# Patient Record
Sex: Male | Born: 1940 | Race: White | Hispanic: No | Marital: Married | Smoking: Never smoker
Health system: Southern US, Community
[De-identification: ages and names within clinical notes are randomized; demographics above are authoritative.]

## PROBLEM LIST (undated history)

## (undated) DIAGNOSIS — C61 Malignant neoplasm of prostate: Secondary | ICD-10-CM

## (undated) DIAGNOSIS — E785 Hyperlipidemia, unspecified: Secondary | ICD-10-CM

## (undated) DIAGNOSIS — L570 Actinic keratosis: Secondary | ICD-10-CM

## (undated) DIAGNOSIS — G43909 Migraine, unspecified, not intractable, without status migrainosus: Secondary | ICD-10-CM

## (undated) DIAGNOSIS — E039 Hypothyroidism, unspecified: Secondary | ICD-10-CM

## (undated) DIAGNOSIS — I7300 Raynaud's syndrome without gangrene: Secondary | ICD-10-CM

## (undated) HISTORY — PX: PROSTATE SURGERY: SHX751

## (undated) HISTORY — DX: Actinic keratosis: L57.0

## (undated) HISTORY — PX: THYROID SURGERY: SHX805

## (undated) HISTORY — PX: TONSILLECTOMY: SUR1361

---

## 2009-04-06 ENCOUNTER — Ambulatory Visit: Payer: Self-pay | Admitting: Internal Medicine

## 2014-04-28 ENCOUNTER — Ambulatory Visit: Payer: Self-pay | Admitting: Gastroenterology

## 2014-05-02 LAB — PATHOLOGY REPORT

## 2016-01-27 ENCOUNTER — Emergency Department: Payer: Medicare Other

## 2016-01-27 ENCOUNTER — Observation Stay: Payer: Medicare Other

## 2016-01-27 ENCOUNTER — Observation Stay
Admit: 2016-01-27 | Discharge: 2016-01-27 | Disposition: A | Discharge status: Home / Self Care | Payer: Medicare Other | Attending: Family Medicine | Admitting: Family Medicine

## 2016-01-27 ENCOUNTER — Observation Stay
Admission: EM | Admit: 2016-01-27 | Discharge: 2016-01-27 | Disposition: A | Discharge status: Home / Self Care | Payer: Medicare Other | Attending: Internal Medicine | Admitting: Internal Medicine

## 2016-01-27 ENCOUNTER — Encounter: Payer: Self-pay | Admitting: Emergency Medicine

## 2016-01-27 DIAGNOSIS — E785 Hyperlipidemia, unspecified: Secondary | ICD-10-CM | POA: Insufficient documentation

## 2016-01-27 DIAGNOSIS — Z823 Family history of stroke: Secondary | ICD-10-CM | POA: Insufficient documentation

## 2016-01-27 DIAGNOSIS — E039 Hypothyroidism, unspecified: Secondary | ICD-10-CM | POA: Diagnosis not present

## 2016-01-27 DIAGNOSIS — G454 Transient global amnesia: Secondary | ICD-10-CM | POA: Diagnosis not present

## 2016-01-27 DIAGNOSIS — Z8546 Personal history of malignant neoplasm of prostate: Secondary | ICD-10-CM | POA: Insufficient documentation

## 2016-01-27 DIAGNOSIS — Z8669 Personal history of other diseases of the nervous system and sense organs: Secondary | ICD-10-CM | POA: Insufficient documentation

## 2016-01-27 DIAGNOSIS — Z9889 Other specified postprocedural states: Secondary | ICD-10-CM | POA: Insufficient documentation

## 2016-01-27 DIAGNOSIS — G459 Transient cerebral ischemic attack, unspecified: Secondary | ICD-10-CM | POA: Diagnosis not present

## 2016-01-27 DIAGNOSIS — R413 Other amnesia: Secondary | ICD-10-CM | POA: Diagnosis present

## 2016-01-27 DIAGNOSIS — H409 Unspecified glaucoma: Secondary | ICD-10-CM | POA: Insufficient documentation

## 2016-01-27 DIAGNOSIS — Z882 Allergy status to sulfonamides status: Secondary | ICD-10-CM | POA: Diagnosis not present

## 2016-01-27 DIAGNOSIS — I6522 Occlusion and stenosis of left carotid artery: Secondary | ICD-10-CM | POA: Insufficient documentation

## 2016-01-27 HISTORY — DX: Malignant neoplasm of prostate: C61

## 2016-01-27 HISTORY — DX: Migraine, unspecified, not intractable, without status migrainosus: G43.909

## 2016-01-27 LAB — COMPREHENSIVE METABOLIC PANEL
ALBUMIN: 4.3 g/dL (ref 3.5–5.0)
ALT: 20 U/L (ref 17–63)
AST: 23 U/L (ref 15–41)
Alkaline Phosphatase: 57 U/L (ref 38–126)
Anion gap: 5 (ref 5–15)
BUN: 14 mg/dL (ref 6–20)
CHLORIDE: 107 mmol/L (ref 101–111)
CO2: 25 mmol/L (ref 22–32)
CREATININE: 0.77 mg/dL (ref 0.61–1.24)
Calcium: 9.2 mg/dL (ref 8.9–10.3)
GFR calc Af Amer: >60 mL/min (ref >60)
GFR calc non Af Amer: >60 mL/min (ref >60)
Glucose, Bld: 118 mg/dL — ABNORMAL HIGH (ref 65–99)
Potassium: 3.9 mmol/L (ref 3.5–5.1)
SODIUM: 137 mmol/L (ref 135–145)
Total Bilirubin: 0.5 mg/dL (ref 0.3–1.2)
Total Protein: 6.8 g/dL (ref 6.5–8.1)

## 2016-01-27 LAB — CBC
HCT: 46.7 % (ref 40.0–52.0)
HEMOGLOBIN: 16 g/dL (ref 13.0–18.0)
MCH: 31 pg (ref 26.0–34.0)
MCHC: 34.3 g/dL (ref 32.0–36.0)
MCV: 90.3 fL (ref 80.0–100.0)
PLATELETS: 210 10*3/uL (ref 150–440)
RBC: 5.17 MIL/uL (ref 4.40–5.90)
RDW: 14.1 % (ref 11.5–14.5)
WBC: 7.3 10*3/uL (ref 3.8–10.6)

## 2016-01-27 LAB — BASIC METABOLIC PANEL
Anion gap: 5 (ref 5–15)
BUN: 12 mg/dL (ref 6–20)
CALCIUM: 8.8 mg/dL — AB (ref 8.9–10.3)
CO2: 25 mmol/L (ref 22–32)
CREATININE: 0.77 mg/dL (ref 0.61–1.24)
Chloride: 109 mmol/L (ref 101–111)
Glucose, Bld: 112 mg/dL — ABNORMAL HIGH (ref 65–99)
Potassium: 4.2 mmol/L (ref 3.5–5.1)
SODIUM: 139 mmol/L (ref 135–145)

## 2016-01-27 LAB — CBC WITH DIFFERENTIAL/PLATELET
BASOS ABS: 0.1 10*3/uL (ref 0–0.1)
BASOS PCT: 1 %
EOS ABS: 0.2 10*3/uL (ref 0–0.7)
EOS PCT: 3 %
HCT: 49.5 % (ref 40.0–52.0)
Hemoglobin: 17 g/dL (ref 13.0–18.0)
Lymphocytes Relative: 35 %
Lymphs Abs: 2.3 10*3/uL (ref 1.0–3.6)
MCH: 30.8 pg (ref 26.0–34.0)
MCHC: 34.3 g/dL (ref 32.0–36.0)
MCV: 89.8 fL (ref 80.0–100.0)
MONO ABS: 0.6 10*3/uL (ref 0.2–1.0)
Monocytes Relative: 9 %
NEUTROS PCT: 52 %
Neutro Abs: 3.4 10*3/uL (ref 1.4–6.5)
PLATELETS: 217 10*3/uL (ref 150–440)
RBC: 5.51 MIL/uL (ref 4.40–5.90)
RDW: 13.8 % (ref 11.5–14.5)
WBC: 6.5 10*3/uL (ref 3.8–10.6)

## 2016-01-27 LAB — URINALYSIS COMPLETE WITH MICROSCOPIC (ARMC ONLY)
Bacteria, UA: NONE SEEN
Bilirubin Urine: NEGATIVE
GLUCOSE, UA: NEGATIVE mg/dL
Hgb urine dipstick: NEGATIVE
KETONES UR: NEGATIVE mg/dL
Leukocytes, UA: NEGATIVE
NITRITE: NEGATIVE
PH: 6 (ref 5.0–8.0)
Protein, ur: NEGATIVE mg/dL
RBC / HPF: NONE SEEN RBC/hpf (ref 0–5)
SPECIFIC GRAVITY, URINE: 1.01 (ref 1.005–1.030)
Squamous Epithelial / LPF: NONE SEEN
WBC UA: NONE SEEN WBC/hpf (ref 0–5)

## 2016-01-27 LAB — VITAMIN B12: VITAMIN B 12: 556 pg/mL (ref 180–914)

## 2016-01-27 LAB — LIPID PANEL
CHOL/HDL RATIO: 3.9 ratio
CHOLESTEROL: 186 mg/dL (ref 0–200)
HDL: 48 mg/dL (ref >40)
LDL Cholesterol: 128 mg/dL — ABNORMAL HIGH (ref 0–99)
TRIGLYCERIDES: 48 mg/dL (ref <150)
VLDL: 10 mg/dL (ref 0–40)

## 2016-01-27 LAB — TROPONIN I

## 2016-01-27 LAB — FOLATE: FOLATE: 33 ng/mL (ref >5.9)

## 2016-01-27 LAB — TSH: TSH: 0.871 u[IU]/mL (ref 0.350–4.500)

## 2016-01-27 MED ORDER — ENOXAPARIN SODIUM 40 MG/0.4ML ~~LOC~~ SOLN
40.0000 mg | Freq: Every day | SUBCUTANEOUS | Status: DC
  Administered 2016-01-27: 40 mg via SUBCUTANEOUS
  Filled 2016-01-27: qty 0.4

## 2016-01-27 MED ORDER — ACETAMINOPHEN 325 MG PO TABS: 650.0000 mg | ORAL_TABLET | Freq: Four times a day (QID) | ORAL | Status: DC | PRN

## 2016-01-27 MED ORDER — SODIUM CHLORIDE 0.9% FLUSH
3.0000 mL | Freq: Two times a day (BID) | INTRAVENOUS | Status: DC
  Administered 2016-01-27: 3 mL via INTRAVENOUS

## 2016-01-27 MED ORDER — LEVOTHYROXINE SODIUM 100 MCG PO TABS
100.0000 ug | ORAL_TABLET | Freq: Every day | ORAL | Status: DC
  Filled 2016-01-27: qty 1

## 2016-01-27 MED ORDER — ASPIRIN 81 MG PO CHEW
324.0000 mg | CHEWABLE_TABLET | Freq: Once | ORAL | Status: AC
  Administered 2016-01-27: 324 mg via ORAL
  Filled 2016-01-27: qty 4

## 2016-01-27 MED ORDER — METOPROLOL TARTRATE 5 MG/5ML IV SOLN: 5.0000 mg | INTRAVENOUS | Status: DC | PRN

## 2016-01-27 MED ORDER — ACYCLOVIR 200 MG PO CAPS: 800.0000 mg | ORAL_CAPSULE | Freq: Every day | ORAL | Status: DC

## 2016-01-27 MED ORDER — ONDANSETRON HCL 4 MG/2ML IJ SOLN: 4.0000 mg | Freq: Four times a day (QID) | INTRAMUSCULAR | Status: DC | PRN

## 2016-01-27 MED ORDER — ACETAMINOPHEN 650 MG RE SUPP: 650.0000 mg | Freq: Four times a day (QID) | RECTAL | Status: DC | PRN

## 2016-01-27 MED ORDER — SENNOSIDES-DOCUSATE SODIUM 8.6-50 MG PO TABS: 1.0000 | ORAL_TABLET | Freq: Every evening | ORAL | Status: DC | PRN

## 2016-01-27 MED ORDER — ATORVASTATIN CALCIUM 40 MG PO TABS: 40.0000 mg | ORAL_TABLET | Freq: Every day | ORAL | Status: DC

## 2016-01-27 MED ORDER — ASPIRIN 81 MG PO CHEW
81.0000 mg | CHEWABLE_TABLET | Freq: Every day | ORAL | Status: DC
  Administered 2016-01-27: 81 mg via ORAL
  Filled 2016-01-27: qty 1

## 2016-01-27 MED ORDER — LATANOPROST 0.005 % OP SOLN
1.0000 [drp] | Freq: Every day | OPHTHALMIC | Status: DC
  Filled 2016-01-27: qty 2.5

## 2016-01-27 MED ORDER — ASPIRIN 81 MG PO CHEW: 81.0000 mg | CHEWABLE_TABLET | Freq: Every day | ORAL | Status: DC

## 2016-01-27 MED ORDER — ONDANSETRON HCL 4 MG PO TABS: 4.0000 mg | ORAL_TABLET | Freq: Four times a day (QID) | ORAL | Status: DC | PRN

## 2016-01-27 MED ORDER — ACYCLOVIR 800 MG PO TABS
800.0000 mg | ORAL_TABLET | Freq: Every day | ORAL | Status: DC
  Administered 2016-01-27: 08:00:00 800 mg via ORAL
  Filled 2016-01-27: qty 1

## 2016-01-27 NOTE — Care Management Obs Status (Signed)
[facility] NOTIFICATION   Patient Details  Name: Ralph Brown MRN: [id number] Date of Birth: 07/08/1941   Medicare Observation Status Notification Given:  No (discharge order less than 24 hours)    Ival Bible, RN 01/27/2016, 5:51 PM

## 2016-01-27 NOTE — Progress Notes (Signed)
Pt has been discharged home. Discharge papers giving and explained to pt. F/U appointment and meds reviewed with pt. Pt to pick up RX from pharmacy and to scheduled the f/u appointment. Pt is aware. Pt declined to be escorted on a wheelchair.

## 2016-01-27 NOTE — ED Notes (Signed)
Admitting MD at bedside.

## 2016-01-27 NOTE — Discharge Summary (Signed)
[facility] [place] NAME: Ralph Brown    MR#:  [id number]  DATE OF BIRTH:  December 21, 1940  DATE OF ADMISSION:  01/27/2016 ADMITTING PHYSICIAN: Quintella Baton, MD  DATE OF DISCHARGE: 01/27/2016  PRIMARY CARE PHYSICIAN: Dr. Fulton Reek   ADMISSION DIAGNOSIS:  Memory deficit [R41.3] Transient cerebral ischemia, unspecified transient cerebral ischemia type [G45.9]  DISCHARGE DIAGNOSIS:  Transient global and lesion  SECONDARY DIAGNOSIS:   Past Medical History  Diagnosis Date  . Migraines   . Prostate cancer [facility])     HOSPITAL COURSE:   1. Transient global amnesia. If stroke workup comes back negative including MRI of the brain and carotid ultrasound and echocardiogram the patient can potentially go home today. I would continue an aspirin at this point. 2. Hypothyroidism unspecified. Continue levothyroxine. TSH in the normal range. 3. Glaucoma unspecified on latanoprost 4. History of migraine 5. History of prostate cancer   DISCHARGE CONDITIONS:   Satisfactory  CONSULTS OBTAINED:  Treatment Team:  Alexis Goodell, MD  DRUG ALLERGIES:   Allergies  Allergen Reactions  . Sulfa Antibiotics Other (See Comments)    Unknown reaction- childhood allergy    DISCHARGE MEDICATIONS:   Current Discharge Medication List    START taking these medications   Details  aspirin 81 MG chewable tablet Chew 1 tablet (81 mg total) by mouth daily.      CONTINUE these medications which have NOT CHANGED   Details  acyclovir (ZOVIRAX) 800 MG tablet Take 1 tablet by mouth daily.    bimatoprost (LUMIGAN) 0.01 % SOLN Place 1 drop into both eyes at bedtime.    Fish Oil-Cholecalciferol (FISH OIL + D3 PO) Take 1 capsule by mouth daily.    Glucosamine-Chondroitin 750-600 MG TABS Take 1 tablet by mouth daily.    levothyroxine (SYNTHROID, LEVOTHROID) 100 MCG tablet Take 100 mcg by mouth daily before breakfast.    Multiple Vitamin (MULTIVITAMIN)  capsule Take 1 capsule by mouth daily.         DISCHARGE INSTRUCTIONS:   Satisfactory  If you experience worsening of your admission symptoms, develop shortness of breath, life threatening emergency, suicidal or homicidal thoughts you must seek medical attention immediately by calling 911 or calling your MD immediately  if symptoms less severe.  You Must read complete instructions/literature along with all the possible adverse reactions/side effects for all the Medicines you take and that have been prescribed to you. Take any new Medicines after you have completely understood and accept all the possible adverse reactions/side effects.   Please note  You were cared for by a hospitalist during your hospital stay. If you have any questions about your discharge medications or the care you received while you were in the hospital after you are discharged, you can call the unit and asked to speak with the hospitalist on call if the hospitalist that took care of you is not available. Once you are discharged, your primary care physician will handle any further medical issues. Please note that NO REFILLS for any discharge medications will be authorized once you are discharged, as it is imperative that you return to your primary care physician (or establish a relationship with a primary care physician if you do not have one) for your aftercare needs so that they can reassess your need for medications and monitor your lab values.    Today   CHIEF COMPLAINT:   Chief Complaint  Patient presents with  . Memory Loss    HISTORY  OF PRESENT ILLNESS:  Ralph Brown  is a 75 y.o. male presented with a one hour timeframe of memory loss   VITAL SIGNS:  Blood pressure 145/80, pulse 60, temperature 98 F (36.7 C), temperature source Oral, resp. rate 18, height 5\' 9"  (1.753 m), weight 67.268 kg (148 lb 4.8 oz), SpO2 98 %.    PHYSICAL EXAMINATION:  GENERAL:  75 y.o.-year-old patient lying in the bed with  no acute distress.  EYES: Pupils equal, round, reactive to light and accommodation. No scleral icterus. Extraocular muscles intact.  HEENT: Head atraumatic, normocephalic. Oropharynx and nasopharynx clear.  NECK:  Supple, no jugular venous distention. No thyroid enlargement, no tenderness.  LUNGS: Normal breath sounds bilaterally, no wheezing, rales,rhonchi or crepitation. No use of accessory muscles of respiration.  CARDIOVASCULAR: S1, S2 normal. No murmurs, rubs, or gallops.  ABDOMEN: Soft, non-tender, non-distended. Bowel sounds present. No organomegaly or mass.  EXTREMITIES: No pedal edema, cyanosis, or clubbing.  NEUROLOGIC: Cranial nerves II through XII are intact. Muscle strength 5/5 in all extremities. Sensation intact. Gait not checked.  PSYCHIATRIC: The patient is alert and oriented x 3.  SKIN: No obvious rash, lesion, or ulcer.   DATA REVIEW:   CBC  Recent Labs Lab 01/27/16 0439  WBC 7.3  HGB 16.0  HCT 46.7  PLT 210    Chemistries   Recent Labs Lab 01/27/16 0042 01/27/16 0439  NA 137 139  K 3.9 4.2  CL 107 109  CO2 25 25  GLUCOSE 118* 112*  BUN 14 12  CREATININE 0.77 0.77  CALCIUM 9.2 8.8*  AST 23  --   ALT 20  --   ALKPHOS 57  --   BILITOT 0.5  --     Cardiac Enzymes  Recent Labs Lab 01/27/16 0042  TROPONINI <0.03     RADIOLOGY:  Ct Head Wo Contrast  01/27/2016  CLINICAL DATA:  Sudden onset of short-term memory loss tonight. EXAM: CT HEAD WITHOUT CONTRAST TECHNIQUE: Contiguous axial images were obtained from the base of the skull through the vertex without intravenous contrast. COMPARISON:  None. FINDINGS: Brain: No intracranial hemorrhage, mass effect, or midline shift. No hydrocephalus. The basilar cisterns are patent. No evidence of territorial infarct. Minimal periventricular white matter change. No intracranial fluid collection. Vascular: No hyperdense vessel or abnormal calcification. Skull:  Calvarium is intact. Sinuses/Orbits: The orbits are  unremarkable. Included paranasal sinuses and mastoid air cells are well aerated. Other: None. IMPRESSION: No acute intracranial abnormality. Electronically Signed   By: Jeb Levering M.D.   On: 01/27/2016 01:08    Management plans discussed with the patient, family and they are in agreement.  CODE STATUS:     Code Status Orders        Start     Ordered   01/27/16 0423  Full code   Continuous     01/27/16 0422    Code Status History    Date Active Date Inactive Code Status Order ID Comments User Context   This patient has a current code status but no historical code status.    Advance Directive Documentation        Most Recent Value   Type of Advance Directive  Healthcare Power of Attorney, Living will   Pre-existing out of facility DNR order (yellow form or pink MOST form)     "MOST" Form in Place?        TOTAL TIME TAKING CARE OF THIS PATIENT: 35 minutes.    Loletha Grayer M.D on  01/27/2016 at 10:57 AM  Between 7am to 6pm - Pager - [phone]  After 6pm go to [email or web address] - password [email or web address]  [facility] Office  [phone]  CC: Primary care physician; Dr. Fulton Reek

## 2016-01-27 NOTE — H&P (Signed)
PCP:   No primary care provider on file.   Chief Complaint:  Memory loss  HPI: This is a 75 year old male who during the night his wife noted that he was having issues with his memory. She states she first noted that he did not put in his eye drops prior to going to bed, which was routine. He did not seem to remember things that they did earlier that day. She went on quering him about events that occurred over the past 2-3 days and 2 he had no recollection. This lasted approximately 60-90 minutes and then began improving. She brought him to the ER. This has never happened before. He had no other neurological deficits, no slurred speech no localized weakness. His memory improved slowly, by the time of my interview his memories was back to baseline. History  provided by the patient as well as his wife present at bedside.  Review of Systems:  The patient denies anorexia, fever, memory loss, vision loss, decreased hearing, hoarseness, chest pain, syncope, dyspnea on exertion, peripheral edema, balance deficits, hemoptysis, abdominal pain, melena, hematochezia, severe indigestion/heartburn, hematuria, incontinence, genital sores, muscle weakness, suspicious skin lesions, transient blindness, difficulty walking, depression, unusual weight change, abnormal bleeding, enlarged lymph nodes, angioedema, and breast masses.  Past Medical History: Past Medical History  Diagnosis Date  . Migraines   . Prostate cancer [facility])    Past Surgical History  Procedure Laterality Date  . Tonsillectomy    . Thyroid surgery    . Prostate surgery      Medications: Prior to Admission medications   Medication Sig Start Date End Date Taking? Authorizing Provider  acyclovir (ZOVIRAX) 800 MG tablet Take 1 tablet by mouth daily. 01/16/16  Yes Historical Provider, MD  bimatoprost (LUMIGAN) 0.01 % SOLN Place 1 drop into both eyes at bedtime.   Yes Historical Provider, MD  Fish Oil-Cholecalciferol (FISH OIL + D3 PO) Take 1  capsule by mouth daily.   Yes Historical Provider, MD  Glucosamine-Chondroitin 750-600 MG TABS Take 1 tablet by mouth daily.   Yes Historical Provider, MD  levothyroxine (SYNTHROID, LEVOTHROID) 100 MCG tablet Take 100 mcg by mouth daily before breakfast.   Yes Historical Provider, MD  Multiple Vitamin (MULTIVITAMIN) capsule Take 1 capsule by mouth daily.   Yes Historical Provider, MD    Allergies:   Allergies  Allergen Reactions  . Sulfa Antibiotics Other (See Comments)    Unknown reaction- childhood allergy    Social History:  reports that he has never smoked. He does not have any smokeless tobacco history on file. He reports that he drinks alcohol. He reports that he does not use illicit drugs.  Family History: Hemorrhagic CVA-father  Physical Exam: Filed Vitals:   01/27/16 0100 01/27/16 0130 01/27/16 0200  BP: 145/88 142/83 145/84  Pulse: 62 63 64  Resp: 13 13   SpO2: 97% 96% 95%    General:  Alert and oriented times three, well developed and nourished, no acute distress Eyes: PERRLA, pink conjunctiva, no scleral icterus ENT: Moist oral mucosa, neck supple, no thyromegaly Lungs: clear to ascultation, no wheeze, no crackles, no use of accessory muscles Cardiovascular: regular rate and rhythm, no regurgitation, no gallops, no murmurs. No carotid bruits, no JVD Abdomen: soft, positive BS, non-tender, non-distended, no organomegaly, not an acute abdomen GU: not examined Neuro: CN II - XII grossly intact, sensation intact Musculoskeletal: strength 5/5 all extremities, no clubbing, cyanosis or edema Skin: no rash, no subcutaneous crepitation, no decubitus Psych: appropriate patient  Labs on Admission:   Recent Labs  01/27/16 0042  NA 137  K 3.9  CL 107  CO2 25  GLUCOSE 118*  BUN 14  CREATININE 0.77  CALCIUM 9.2    Recent Labs  01/27/16 0042  AST 23  ALT 20  ALKPHOS 57  BILITOT 0.5  PROT 6.8  ALBUMIN 4.3   No results for input(s): LIPASE, AMYLASE in the  last 72 hours.  Recent Labs  01/27/16 0042  WBC 6.5  NEUTROABS 3.4  HGB 17.0  HCT 49.5  MCV 89.8  PLT 217    Recent Labs  01/27/16 0042  TROPONINI <0.03   Invalid input(s): POCBNP No results for input(s): DDIMER in the last 72 hours. No results for input(s): HGBA1C in the last 72 hours. No results for input(s): CHOL, HDL, LDLCALC, TRIG, CHOLHDL, LDLDIRECT in the last 72 hours. No results for input(s): TSH, T4TOTAL, T3FREE, THYROIDAB in the last 72 hours.  Invalid input(s): FREET3 No results for input(s): VITAMINB12, FOLATE, FERRITIN, TIBC, IRON, RETICCTPCT in the last 72 hours.  Micro Results: No results found for this or any previous visit (from the past 240 hour(s)).   Radiological Exams on Admission: Ct Head Wo Contrast  01/27/2016  CLINICAL DATA:  Sudden onset of short-term memory loss tonight. EXAM: CT HEAD WITHOUT CONTRAST TECHNIQUE: Contiguous axial images were obtained from the base of the skull through the vertex without intravenous contrast. COMPARISON:  None. FINDINGS: Brain: No intracranial hemorrhage, mass effect, or midline shift. No hydrocephalus. The basilar cisterns are patent. No evidence of territorial infarct. Minimal periventricular white matter change. No intracranial fluid collection. Vascular: No hyperdense vessel or abnormal calcification. Skull:  Calvarium is intact. Sinuses/Orbits: The orbits are unremarkable. Included paranasal sinuses and mastoid air cells are well aerated. Other: None. IMPRESSION: No acute intracranial abnormality. Electronically Signed   By: Jeb Levering M.D.   On: 01/27/2016 01:08    Assessment/Plan Present on Admission:  . TIA (transient ischemic attack) -Bring in for 23 hour observation -Aspirin 81 mg daily -Lipid panel in a.m. -Neurochecks every 2 hours for 5 occurrences -MRI brain, 2-D echo, carotid ultrasound in a.m.  History of prostate cancer -Stable. In remission  Hypothyroidism -Stable, resume home  medications Umar Patmon 01/27/2016, 2:56 AM

## 2016-01-27 NOTE — Consult Note (Signed)
Referring Physician: Leslye Peer    Chief Complaint: Episode of amnesia  HPI: Ralph Brown is an 75 y.o. male who was home with his wife and after sex became amnestic.  Patient was unable to remember the events from the prior two days, or the year but did recognize his family members and was able to perform all ADL's and navigate around his house.  Patient improved significantly after an hour and per wife was back to baseline in 2-3 hours.  Patient presented for evaluation with initial NIHSS of 0.  Patient has rermained at baseline.  More of his memory has returned but there is still a short period of time that he is unable to recall.    Date last known well: 01/26/2016 Time last known well: Time: 22:30 tPA Given: No: Resolution of symptoms  Past Medical History  Diagnosis Date  . Migraines   . Prostate cancer [facility])     Past Surgical History  Procedure Laterality Date  . Tonsillectomy    . Thyroid surgery    . Prostate surgery      Family history: Father with prostate cancer.  Dies at the age of 12 due to a hemorrhagic infarct.  Mother with a history of migraines  Social History:  reports that he has never smoked. He does not have any smokeless tobacco history on file. He reports that he drinks alcohol. He reports that he does not use illicit drugs.  Allergies:  Allergies  Allergen Reactions  . Sulfa Antibiotics Other (See Comments)    Unknown reaction- childhood allergy    Medications:  I have reviewed the patient's current medications. Prior to Admission:  Prescriptions prior to admission  Medication Sig Dispense Refill Last Dose  . acyclovir (ZOVIRAX) 800 MG tablet Take 1 tablet by mouth daily.   01/26/2016 at 0900  . bimatoprost (LUMIGAN) 0.01 % SOLN Place 1 drop into both eyes at bedtime.   01/26/2016 at 2300  . Fish Oil-Cholecalciferol (FISH OIL + D3 PO) Take 1 capsule by mouth daily.   01/26/2016 at 0900  . Glucosamine-Chondroitin 750-600 MG TABS Take 1 tablet by mouth daily.    01/26/2016 at 0900  . levothyroxine (SYNTHROID, LEVOTHROID) 100 MCG tablet Take 100 mcg by mouth daily before breakfast.   01/26/2016 at 0900  . Multiple Vitamin (MULTIVITAMIN) capsule Take 1 capsule by mouth daily.   Past Week at Unknown time   Scheduled: . [START ON 01/28/2016] acyclovir  800 mg Oral Daily  . aspirin  81 mg Oral Daily  . enoxaparin (LOVENOX) injection  40 mg Subcutaneous Daily  . latanoprost  1 drop Both Eyes QHS  . levothyroxine  100 mcg Oral QAC breakfast  . sodium chloride flush  3 mL Intravenous Q12H    ROS: History obtained from the patient  General ROS: negative for - chills, fatigue, fever, night sweats, weight gain or weight loss Psychological ROS: negative for - behavioral disorder, hallucinations, memory difficulties, mood swings or suicidal ideation Ophthalmic ROS: negative for - blurry vision, double vision, eye pain or loss of vision ENT ROS: negative for - epistaxis, nasal discharge, oral lesions, sore throat, tinnitus or vertigo Allergy and Immunology ROS: negative for - hives or itchy/watery eyes Hematological and Lymphatic ROS: negative for - bleeding problems, bruising or swollen lymph nodes Endocrine ROS: negative for - galactorrhea, hair pattern changes, polydipsia/polyuria or temperature intolerance Respiratory ROS: negative for - cough, hemoptysis, shortness of breath or wheezing Cardiovascular ROS: negative for - chest pain, dyspnea on  exertion, edema or irregular heartbeat Gastrointestinal ROS: negative for - abdominal pain, diarrhea, hematemesis, nausea/vomiting or stool incontinence Genito-Urinary ROS: negative for - dysuria, hematuria, incontinence or urinary frequency/urgency Musculoskeletal ROS: negative for - joint swelling or muscular weakness Neurological ROS: as noted in HPI Dermatological ROS: negative for rash and skin lesion changes  Physical Examination: Blood pressure 130/74, pulse 67, temperature 98.6 F (37 C), temperature source  Oral, resp. rate 20, height 5\' 9"  (1.753 m), weight 67.268 kg (148 lb 4.8 oz), SpO2 97 %.  HEENT-  Normocephalic, no lesions, without obvious abnormality.  Normal external eye and conjunctiva.  Normal TM's bilaterally.  Normal auditory canals and external ears. Normal external nose, mucus membranes and septum.  Normal pharynx. Cardiovascular- S1, S2 normal, pulses palpable throughout   Lungs- chest clear, no wheezing, rales, normal symmetric air entry Abdomen- soft, non-tender; bowel sounds normal; no masses,  no organomegaly Extremities- no edema Lymph-no adenopathy palpable Musculoskeletal-no joint tenderness, deformity or swelling Skin-warm and dry, no hyperpigmentation, vitiligo, or suspicious lesions  Neurological Examination Mental Status: Alert, oriented, thought content appropriate.  Speech fluent without evidence of aphasia.  Able to follow 3 step commands without difficulty. Cranial Nerves: II: Discs flat bilaterally; Visual fields grossly normal, pupils equal, round, reactive to light and accommodation III,IV, VI: ptosis not present, extra-ocular motions intact bilaterally V,VII: smile symmetric, facial light touch sensation normal bilaterally VIII: hearing normal bilaterally IX,X: gag reflex present XI: bilateral shoulder shrug XII: midline tongue extension Motor: Right : Upper extremity   5/5    Left:     Upper extremity   5/5  Lower extremity   5/5     Lower extremity   5/5 Tone and bulk:normal tone throughout; no atrophy noted Sensory: Pinprick and light touch intact throughout, bilaterally Deep Tendon Reflexes: 2+ and symmetric throughout Plantars: Right: downgoing   Left: downgoing Cerebellar: Normal finger-to-nose and normal heel-to-shin testing bilaterally Gait: normal gait and station    Laboratory Studies:  Basic Metabolic Panel:  Recent Labs Lab 01/27/16 0042 01/27/16 0439  NA 137 139  K 3.9 4.2  CL 107 109  CO2 25 25  GLUCOSE 118* 112*  BUN 14 12   CREATININE 0.77 0.77  CALCIUM 9.2 8.8*    Liver Function Tests:  Recent Labs Lab 01/27/16 0042  AST 23  ALT 20  ALKPHOS 57  BILITOT 0.5  PROT 6.8  ALBUMIN 4.3   No results for input(s): LIPASE, AMYLASE in the last 168 hours. No results for input(s): AMMONIA in the last 168 hours.  CBC:  Recent Labs Lab 01/27/16 0042 01/27/16 0439  WBC 6.5 7.3  NEUTROABS 3.4  --   HGB 17.0 16.0  HCT 49.5 46.7  MCV 89.8 90.3  PLT 217 210    Cardiac Enzymes:  Recent Labs Lab 01/27/16 0042  TROPONINI <0.03    BNP: Invalid input(s): POCBNP  CBG: No results for input(s): GLUCAP in the last 168 hours.  Microbiology: No results found for this or any previous visit.  Coagulation Studies: No results for input(s): LABPROT, INR in the last 72 hours.  Urinalysis:  Recent Labs Lab 01/27/16 0910  COLORURINE YELLOW*  LABSPEC 1.010  PHURINE 6.0  GLUCOSEU NEGATIVE  HGBUR NEGATIVE  BILIRUBINUR NEGATIVE  KETONESUR NEGATIVE  PROTEINUR NEGATIVE  NITRITE NEGATIVE  LEUKOCYTESUR NEGATIVE    Lipid Panel:    Component Value Date/Time   CHOL 186 01/27/2016 0439   TRIG 48 01/27/2016 0439   HDL 48 01/27/2016 0439   CHOLHDL  3.9 01/27/2016 0439   VLDL 10 01/27/2016 0439   LDLCALC 128* 01/27/2016 0439    HgbA1C: No results found for: HGBA1C  Urine Drug Screen:  No results found for: LABOPIA, COCAINSCRNUR, LABBENZ, AMPHETMU, THCU, LABBARB  Alcohol Level: No results for input(s): ETH in the last 168 hours.  Other results: EKG: sinus rhythm at 68 bpm.  Imaging: Ct Head Wo Contrast  01/27/2016  CLINICAL DATA:  Sudden onset of short-term memory loss tonight. EXAM: CT HEAD WITHOUT CONTRAST TECHNIQUE: Contiguous axial images were obtained from the base of the skull through the vertex without intravenous contrast. COMPARISON:  None. FINDINGS: Brain: No intracranial hemorrhage, mass effect, or midline shift. No hydrocephalus. The basilar cisterns are patent. No evidence of territorial  infarct. Minimal periventricular white matter change. No intracranial fluid collection. Vascular: No hyperdense vessel or abnormal calcification. Skull:  Calvarium is intact. Sinuses/Orbits: The orbits are unremarkable. Included paranasal sinuses and mastoid air cells are well aerated. Other: None. IMPRESSION: No acute intracranial abnormality. Electronically Signed   By: Jeb Levering M.D.   On: 01/27/2016 01:08   Mr Brain Wo Contrast  01/27/2016  CLINICAL DATA:  75 year old male with abrupt onset memory problems. Initial encounter. EXAM: MRI HEAD WITHOUT CONTRAST TECHNIQUE: Multiplanar, multiecho pulse sequences of the brain and surrounding structures were obtained without intravenous contrast. COMPARISON:  Head CT without contrast 0030 hours today. FINDINGS: Cerebral volume is within normal limits for age. No restricted diffusion to suggest acute infarction. No midline shift, mass effect, evidence of mass lesion, ventriculomegaly, extra-axial collection or acute intracranial hemorrhage. Cervicomedullary junction and pituitary are within normal limits. Negative visualized cervical spine. Major intracranial vascular flow voids are preserved, with a degree of generalized intracranial artery dolichoectasia. The distal right vertebral artery appears dominant. Scattered small and fairly mild for age nonspecific cerebral white matter T2 and FLAIR hyperintense foci. No cortical encephalomalacia identified. Hippocampal formations and mesial temporal lobe structures appear normal for age. No chronic cerebral blood products. Deep gray matter nuclei, brainstem, and cerebellum are normal for age. Visible internal auditory structures appear normal. Mastoids are clear. Trace paranasal sinus mucosal thickening. Negative orbit and scalp soft tissues. Normal bone marrow signal. IMPRESSION: No acute intracranial abnormality and largely unremarkable for age noncontrast MRI appearance of the brain. Electronically Signed   By: Genevie Ann M.D.   On: 01/27/2016 11:45   US Carotid Bilateral  01/27/2016  CLINICAL DATA:  Memory deficit. EXAM: BILATERAL CAROTID DUPLEX ULTRASOUND TECHNIQUE: Pearline Cables scale imaging, color Doppler and duplex ultrasound were performed of bilateral carotid and vertebral arteries in the neck. COMPARISON:  None. FINDINGS: Criteria: Quantification of carotid stenosis is based on velocity parameters that correlate the residual internal carotid diameter with NASCET-based stenosis levels, using the diameter of the distal internal carotid lumen as the denominator for stenosis measurement. The following velocity measurements were obtained: RIGHT ICA:  60 cm/sec CCA:  82 cm/sec SYSTOLIC ICA/CCA RATIO:  [id number] DIASTOLIC ICA/CCA RATIO:  [id number] ECA:  93 cm/sec LEFT ICA:  47 cm/sec CCA:  80 cm/sec SYSTOLIC ICA/CCA RATIO:  [id number] DIASTOLIC ICA/CCA RATIO:  [id number] ECA:  104 cm/sec RIGHT CAROTID ARTERY: Mild soft plaque at the right carotid bulb and bifurcation with less than 20% proximal right internal carotid artery stenosis by grayscale imaging. No hemodynamically significant right internal carotid artery stenosis by Doppler. RIGHT VERTEBRAL ARTERY:  Normal antegrade flow. LEFT CAROTID ARTERY: Mild soft plaque in the left carotid bulb and bifurcation, with approximately 20% proximal left internal carotid artery stenosis  by grayscale imaging. No hemodynamically significant left internal carotid artery stenosis by Doppler. LEFT VERTEBRAL ARTERY:  Normal antegrade flow. IMPRESSION: 1. Mild bilateral carotid plaque, with no hemodynamically significant carotid stenosis (0 - 49%). 2. Normal antegrade flow in both vertebral arteries. Electronically Signed   By: Ilona Sorrel M.D.   On: 01/27/2016 12:21    Assessment: 75 y.o. male presenting after an episode of amnesia.  Concern is for TGA versus TIA.  Episode was short making TGA less likely.  Patient now at baseline.  On no antiplatelet therapy at home.  MRI of the brain personally reviewed and shows  no acute changes.  Carotid dopplers show no evidence of hemodynamically significant stenosis.  Echocardiogram results pending.  LDL 128.  Stroke Risk Factors - hyperlipidemia  Plan: 1. Prophylactic therapy-Antiplatelet med: Aspirin - dose 81mg  daily 2. Telemetry monitoring 3. Frequent neuro checks 4. Initiation of statin therapy with goal LDL<70. 5. EEG which may be performed as an outpatient 6. Echocardiogram results to be followed up    Alexis Goodell, MD Neurology [phone] 01/27/2016, 4:27 PM

## 2016-01-27 NOTE — Progress Notes (Signed)
Patient ID: Ralph Brown, male   DOB: 1941/01/25, 75 y.o.   MRN: [id number] [employer] [facility] PROGRESS NOTE  CARON THAN [id number] DOB: [birth year] DOA: 01/27/2016 PCP: No primary care provider on file.  HPI/Subjective: Patient made love to his wife last night and then had a period of an hour where he had no memory. He states his memory is good today. Prior to this episode he felt well. After the episode he was concerned. Right now feels well. He offers no complaints.  Objective: Filed Vitals:   01/27/16 0357 01/27/16 0636  BP: 143/82 145/80  Pulse: 62 60  Temp: 97.7 F (36.5 C) 98 F (36.7 C)  Resp: 18 18    Filed Weights   01/27/16 0357  Weight: 67.268 kg (148 lb 4.8 oz)    ROS: Review of Systems  Constitutional: Negative for fever and chills.  Eyes: Negative for blurred vision.  Respiratory: Negative for cough and shortness of breath.   Cardiovascular: Negative for chest pain.  Gastrointestinal: Negative for nausea, vomiting, abdominal pain, diarrhea and constipation.  Genitourinary: Negative for dysuria.  Musculoskeletal: Negative for joint pain.  Neurological: Negative for dizziness and headaches.   Exam: Physical Exam  Constitutional: He is oriented to person, place, and time.  HENT:  Nose: No mucosal edema.  Mouth/Throat: No oropharyngeal exudate or posterior oropharyngeal edema.  Eyes: Conjunctivae, EOM and lids are normal. Pupils are equal, round, and reactive to light.  Neck: No JVD present. Carotid bruit is not present. No edema present. No thyroid mass and no thyromegaly present.  Cardiovascular: S1 normal and S2 normal.  Exam reveals no gallop.   No murmur heard. Pulses:      Dorsalis pedis pulses are 2+ on the right side, and 2+ on the left side.  Respiratory: No respiratory distress. He has no wheezes. He has no rhonchi. He has no rales.  GI: Soft. Bowel sounds are normal. There is no tenderness.  Musculoskeletal:       Right ankle: He exhibits no  swelling.       Left ankle: He exhibits no swelling.  Lymphadenopathy:    He has no cervical adenopathy.  Neurological: He is alert and oriented to person, place, and time. He has normal strength. No cranial nerve deficit or sensory deficit. He displays no Babinski's sign on the right side. He displays no Babinski's sign on the left side.  Skin: Skin is warm. No rash noted. Nails show no clubbing.  Psychiatric: He has a normal mood and affect.      Data Reviewed: Basic Metabolic Panel:  Recent Labs Lab 01/27/16 0042 01/27/16 0439  NA 137 139  K 3.9 4.2  CL 107 109  CO2 25 25  GLUCOSE 118* 112*  BUN 14 12  CREATININE 0.77 0.77  CALCIUM 9.2 8.8*   Liver Function Tests:  Recent Labs Lab 01/27/16 0042  AST 23  ALT 20  ALKPHOS 57  BILITOT 0.5  PROT 6.8  ALBUMIN 4.3   CBC:  Recent Labs Lab 01/27/16 0042 01/27/16 0439  WBC 6.5 7.3  NEUTROABS 3.4  --   HGB 17.0 16.0  HCT 49.5 46.7  MCV 89.8 90.3  PLT 217 210   Cardiac Enzymes:  Recent Labs Lab 01/27/16 0042  TROPONINI <0.03     Studies: Ct Head Wo Contrast  01/27/2016  CLINICAL DATA:  Sudden onset of short-term memory loss tonight. EXAM: CT HEAD WITHOUT CONTRAST TECHNIQUE: Contiguous axial images were obtained from the base of the  skull through the vertex without intravenous contrast. COMPARISON:  None. FINDINGS: Brain: No intracranial hemorrhage, mass effect, or midline shift. No hydrocephalus. The basilar cisterns are patent. No evidence of territorial infarct. Minimal periventricular white matter change. No intracranial fluid collection. Vascular: No hyperdense vessel or abnormal calcification. Skull:  Calvarium is intact. Sinuses/Orbits: The orbits are unremarkable. Included paranasal sinuses and mastoid air cells are well aerated. Other: None. IMPRESSION: No acute intracranial abnormality. Electronically Signed   By: Jeb Levering M.D.   On: 01/27/2016 01:08    Scheduled Meds: . [START ON 01/28/2016]  acyclovir  800 mg Oral Daily  . aspirin  81 mg Oral Daily  . enoxaparin (LOVENOX) injection  40 mg Subcutaneous Daily  . latanoprost  1 drop Both Eyes QHS  . levothyroxine  100 mcg Oral QAC breakfast  . sodium chloride flush  3 mL Intravenous Q12H    Assessment/Plan:  1. Transient global amnesia. Patient's symptoms have resolved. Awaiting TIA workup. If MRI of the brain is negative he can go home. Awaiting neurology consultation. 2. Hypothyroidism unspecified continue levothyroxin 3. Glaucoma unspecified on latanoprost 4. History of migraine 5. History of prostate cancer  Code Status:     Code Status Orders        Start     Ordered   01/27/16 0423  Full code   Continuous     01/27/16 0422    Code Status History    Date Active Date Inactive Code Status Order ID Comments User Context   This patient has a current code status but no historical code status.    Advance Directive Documentation        Most Recent Value   Type of Advance Directive  Healthcare Power of Attorney, Living will   Pre-existing out of facility DNR order (yellow form or pink MOST form)     "MOST" Form in Place?       Disposition Plan: Potentially home this afternoon if stroke workup negative  Consultants:  Neurology  Time spent: 35 minutes  [place], [place]

## 2016-01-27 NOTE — ED Provider Notes (Signed)
[facility] Emergency Department Provider Note   ____________________________________________  Time seen: Approximately 1:18 AM  I have reviewed the triage vital signs and the nursing notes.   HISTORY  Chief Complaint Memory Loss    HPI Ralph Brown is a 75 y.o. male who presents to the ED from home with a chief complaint of memory loss. Patient has a history of prostate cancer and migraines who became disoriented approximately 9:30 PM after making love. Spouse states patient was in the bathroom and could not remember what he has been doing for the past 2 days. She denies slurred speech, leaning, stumbling/following gait. Spouse states patient's memory gradually returned an hour later. Denies falling, striking head or LOC. Denies recent fever, chills, chest pain, shortness of breath, abdominal pain, nausea, vomiting, diarrhea. Nothing made patient's symptoms better or worse. At present he states he has fully recovered his memory.   Past Medical History  Diagnosis Date  . Migraines   . Prostate cancer ([place])     There are no active problems to display for this patient.   Past Surgical History  Procedure Laterality Date  . Tonsillectomy    . Thyroid surgery    . Prostate surgery      No current outpatient prescriptions on file.  Allergies Sulfa antibiotics  No family history on file.  Social History Social History  Substance Use Topics  . Smoking status: Never Smoker   . Smokeless tobacco: None  . Alcohol Use: Yes    Review of Systems  Constitutional: No fever/chills. Eyes: No visual changes. ENT: No sore throat. Cardiovascular: Denies chest pain. Respiratory: Denies shortness of breath. Gastrointestinal: No abdominal pain.  No nausea, no vomiting.  No diarrhea.  No constipation. Genitourinary: Negative for dysuria. Musculoskeletal: Negative for back pain. Skin: Negative for rash. Neurological: Positive for memory deficit.  Negative for headaches, focal weakness or numbness.  10-point ROS otherwise negative.  ____________________________________________   PHYSICAL EXAM:  VITAL SIGNS: ED Triage Vitals  Enc Vitals Group     BP --      Pulse --      Resp --      Temp --      Temp src --      SpO2 --      Weight --      Height --      Head Cir --      Peak Flow --      Pain Score --      Pain Loc --      Pain Edu? --      Excl. in [place]? --     Constitutional: Alert and oriented. Well appearing and in no acute distress. Eyes: Conjunctivae are normal. PERRL. EOMI. Head: Atraumatic. Nose: No congestion/rhinnorhea. Mouth/Throat: Mucous membranes are moist.  Oropharynx non-erythematous. Neck: No stridor.  No carotid bruits. Cardiovascular: Normal rate, regular rhythm. Grossly normal heart sounds.  Good peripheral circulation. Respiratory: Normal respiratory effort.  No retractions. Lungs CTAB. Gastrointestinal: Soft and nontender. No distention. No abdominal bruits. No CVA tenderness. Musculoskeletal: No lower extremity tenderness nor edema.  No joint effusions. Neurologic:  Normal speech and language. No gross focal neurologic deficits are appreciated. No gait instability. Skin:  Skin is warm, dry and intact. No rash noted. Psychiatric: Mood and affect are normal. Speech and behavior are normal.  ____________________________________________   LABS (all labs ordered are listed, but only abnormal results are displayed)  Labs Reviewed  CBC WITH DIFFERENTIAL/PLATELET  COMPREHENSIVE METABOLIC PANEL  TROPONIN I   ____________________________________________  EKG  ED ECG REPORT I, Alainah Phang J, the attending physician, personally viewed and interpreted this ECG.   Date: 01/27/2016  EKG Time: 0039  Rate: 68  Rhythm: normal EKG, normal sinus rhythm  Axis: Normal  Intervals:none  ST&T Change: Nonspecific  ____________________________________________  RADIOLOGY  ED head without contrast  interpreted per Dr. Marisue Humble: No acute intracranial abnormality. ____________________________________________   PROCEDURES  Procedure(s) performed: None  Procedures  Critical Care performed: No  ____________________________________________   INITIAL IMPRESSION / ASSESSMENT AND PLAN / ED COURSE  Pertinent labs & imaging results that were available during my care of the patient were reviewed by me and considered in my medical decision making (see chart for details).  75 year old male who presents with approximately one hour memory loss, now recovered. NIH stroke scale is currently 0; patient does not meet criteria for ED code stroke. CT head is negative, will administer full-strength aspirin. Will discuss with hospitalist to evaluate patient in the emergency department for TIA. ____________________________________________   FINAL CLINICAL IMPRESSION(S) / ED DIAGNOSES  Final diagnoses:  Transient cerebral ischemia, unspecified transient cerebral ischemia type  Memory deficit      NEW MEDICATIONS STARTED DURING THIS VISIT:  New Prescriptions   No medications on file     Note:  This document was prepared using Dragon voice recognition software and may include unintentional dictation errors.    Paulette Blanch, MD 01/27/16 [phone]

## 2016-01-27 NOTE — ED Notes (Signed)
Pt and wife report that at approximately 2230 on 7/8 pt became disoriented to time. Pt oriented to person/place/situation. Pt denies slurred speech, numbness/tingling, problems with mobility/gait at time. Pt/pt's wife reports pt became reoriented to time at approx 2330. Pt currently oriented X 4 - no neurological abnormalities noted on exam. Pt's wife states: "I still feel like he's off, I just can't explain how".  Pt's wife reports that during episode pt could not remember the events of the day; pt could not remember cooking, that there was a family reunion the next day, or any of the events that Saturday or Friday.   Pt reports he is currently able to remember all of the events he had previously forgotten

## 2016-01-27 NOTE — ED Notes (Signed)
Pt presents to ED after he suffered sudden onset of memory loss. Initially noted by wife after she had to remind him about his night time medications, forgot he turned on a lamp, or that he had been cooking all day. Pt had forgotten events from Friday and Saturday but was able to name his children and recognized his wife. Did not know the current year. Denies facial droop, slurred speech, or weakness. Upon arrival pt was able to answer all questions appropriately. Denies any pain but does report nausea just piror to arrival.

## 2016-01-28 LAB — ECHOCARDIOGRAM COMPLETE
Height: 69 in
Weight: 2372.8 oz

## 2018-09-15 ENCOUNTER — Other Ambulatory Visit: Payer: Self-pay | Admitting: Neurology

## 2018-09-15 ENCOUNTER — Other Ambulatory Visit (HOSPITAL_COMMUNITY): Payer: Self-pay | Admitting: Neurology

## 2018-09-15 DIAGNOSIS — G3184 Mild cognitive impairment, so stated: Secondary | ICD-10-CM

## 2018-09-24 ENCOUNTER — Ambulatory Visit (HOSPITAL_COMMUNITY): Payer: Medicare Other

## 2018-10-06 ENCOUNTER — Ambulatory Visit (HOSPITAL_COMMUNITY): Payer: Medicare Other

## 2018-11-22 ENCOUNTER — Ambulatory Visit (HOSPITAL_COMMUNITY): Payer: Medicare Other

## 2018-11-23 ENCOUNTER — Ambulatory Visit (HOSPITAL_COMMUNITY)
Admission: RE | Admit: 2018-11-23 | Discharge: 2018-11-23 | Disposition: A | Discharge status: Home / Self Care | Payer: Medicare Other | Source: Ambulatory Visit | Attending: Neurology | Admitting: Neurology

## 2018-11-23 ENCOUNTER — Other Ambulatory Visit: Payer: Self-pay

## 2018-11-23 DIAGNOSIS — G3184 Mild cognitive impairment, so stated: Secondary | ICD-10-CM | POA: Insufficient documentation

## 2018-11-24 ENCOUNTER — Other Ambulatory Visit (HOSPITAL_COMMUNITY): Payer: Medicare Other

## 2018-11-26 ENCOUNTER — Ambulatory Visit (HOSPITAL_COMMUNITY): Payer: Medicare Other

## 2019-08-11 ENCOUNTER — Ambulatory Visit: Payer: Medicare Other | Attending: Internal Medicine

## 2019-08-11 DIAGNOSIS — Z23 Encounter for immunization: Secondary | ICD-10-CM | POA: Insufficient documentation

## 2019-08-11 NOTE — Progress Notes (Signed)
   Covid-19 Vaccination Clinic  Name:  Ralph Brown    MRN: [id number] DOB: [birth year]  08/11/2019  [name] was observed post Covid-19 immunization for 15 minutes without incidence. He was provided with Vaccine Information Sheet and instruction to access the V-Safe system.   [name] was instructed to call 911 with any severe reactions post vaccine: Marland Kitchen Difficulty breathing  . Swelling of your face and throat  . A fast heartbeat  . A bad rash all over your body  . Dizziness and weakness    Immunizations Administered    Name Date Dose VIS Date Route   Pfizer COVID-19 Vaccine 08/11/2019  3:07 PM 0.3 mL 07/01/2019 Intramuscular   Manufacturer: [place]   Lot: [id number]   [place]: [id number]

## 2019-08-26 ENCOUNTER — Encounter: Payer: Self-pay | Admitting: Dietician

## 2019-08-26 ENCOUNTER — Encounter: Payer: Medicare Other | Attending: Internal Medicine | Admitting: Dietician

## 2019-08-26 ENCOUNTER — Other Ambulatory Visit: Payer: Self-pay

## 2019-08-26 VITALS — Ht 69.0 in | Wt 155.7 lb

## 2019-08-26 DIAGNOSIS — Z713 Dietary counseling and surveillance: Secondary | ICD-10-CM | POA: Diagnosis present

## 2019-08-26 DIAGNOSIS — E118 Type 2 diabetes mellitus with unspecified complications: Secondary | ICD-10-CM | POA: Diagnosis not present

## 2019-08-26 DIAGNOSIS — E119 Type 2 diabetes mellitus without complications: Secondary | ICD-10-CM

## 2019-08-26 NOTE — Patient Instructions (Signed)
   Great job making healthy choices, keep up the good work!  Make sure to include a source of protein with 7grams or more with each meal.   Keep carb intake to 60grams at a time. Allow for one snack in a day if needed for calories.

## 2019-08-26 NOTE — Progress Notes (Signed)
Medical Nutrition Therapy: Visit start time: 1100  end time: 1215  Assessment:  Diagnosis: Type 2 diabetes Past medical history: glaucoma, thyroid disease Psychosocial issues/ stress concerns: none  Preferred learning method:  . Auditory-- talking, discussion . Visual . Hands-on   Current weight: 155.7lbs with shoes, sweater, and jacket Height: 5'9" Medications, supplements: reconciled list in medical record  Progress and evaluation:  Patient reports increased food portions recently to gain weight, his weight has been decreasing in recent months, but is now increasing back to his "normal" of about 150lbs..  He follows lacto-ovo vegetarian diet along with his wife.     Physical activity: walking, elliptical, and/or stationary bike 30-40 minutes 7 days per week  Dietary Intake:  Usual eating pattern includes 3 meals and 0-1 snacks per day. Dining out frequency: 1-2 meals per week.  Breakfast: cooked (5-min) oatmeal with skim milk + apple, berries + coffee with sweet n low & 2tsp whole milk; waffle or french toast homemade or out with sf syrup, fruit banana, berries, coffee Snack: none Lunch: soup and salad; salad and cheese toast + dessert sm portion ie cookie, pie, fruit crunch or candy Snack: none Supper: 5:30-6pm -- potato and salad, fresh/ frozen veg; morningstar meat alternative/ skyr icelandic yogurt occ small piece chocolate Snack: none Beverages: water, occ iced tea or hot tea, coffee in am, protein nut milk, no soda or juice  Nutrition Care Education: Topics covered:  Basic nutrition: basic food groups, appropriate nutrient balance, appropriate meal and snack schedule, general nutrition guidelines for vegetarian eating pattern Weight control: estimated energy needs for weight maintenance or gradual gain at 1800-2000kcal, provided guidance for 55%CHO, 17% protein, 27% fat to mesh with vegetarian pattern; discussed use of protein and healthy fats in moderate amounts to  maintain healthy weight.  Diabetes: appropriate meal and snack schedule, appropriate carb intake and balance, healthy carb choices, role of fiber, protein, fat; vegetarian protein sources and daily protein goal.   Nutritional Diagnosis:  [place]-2.2 Altered nutrition-related laboratory As related to type 2 diabetes.  As evidenced by patient with recent HbA1C of 6.6%.  Intervention:   Instruction and discussion as noted above.  Patient is generally making healthy food choices, no significant dietary change needed.   Established goals to ensure adequate protein and controlled carb intake.   No follow-up scheduled at this time; patient will schedule later if needed.  Education Materials given:  . Plate Planner with food lists, sample meal pattern . Mediterranean diet overview . Vegetarian Proteins . Goals/ instructions   Learner/ who was taught:  . Patient   Level of understanding: Marland Kitchen Verbalizes/ demonstrates competency   Demonstrated degree of understanding via:   Teach back Learning barriers: . None   Willingness to learn/ readiness for change: . Eager, change in progress   Monitoring and Evaluation:  Dietary intake, exercise, BG control, and body weight      follow up: prn

## 2019-09-01 ENCOUNTER — Ambulatory Visit: Payer: Medicare Other | Attending: Internal Medicine

## 2019-09-01 DIAGNOSIS — Z23 Encounter for immunization: Secondary | ICD-10-CM

## 2019-09-01 NOTE — Progress Notes (Signed)
   Covid-19 Vaccination Clinic  Name:  Ralph Brown    MRN: [id number] DOB: [birth year]  09/01/2019  [name] was observed post Covid-19 immunization for 15 minutes without incidence. He was provided with Vaccine Information Sheet and instruction to access the V-Safe system.   [name] was instructed to call 911 with any severe reactions post vaccine: Marland Kitchen Difficulty breathing  . Swelling of your face and throat  . A fast heartbeat  . A bad rash all over your body  . Dizziness and weakness    Immunizations Administered    Name Date Dose VIS Date Route   Pfizer COVID-19 Vaccine 09/01/2019  4:19 PM 0.3 mL 07/01/2019 Intramuscular   Manufacturer: [place]   Lot: [id number]   [place]: [id number]

## 2020-08-15 ENCOUNTER — Encounter: Payer: Medicare Other | Admitting: Dermatology

## 2020-09-06 ENCOUNTER — Encounter: Payer: Medicare Other | Admitting: Dermatology

## 2020-09-06 ENCOUNTER — Other Ambulatory Visit: Payer: Self-pay

## 2020-09-06 ENCOUNTER — Ambulatory Visit: Payer: Medicare Other | Admitting: Dermatology

## 2020-09-06 DIAGNOSIS — I8310 Varicose veins of unspecified lower extremity with inflammation: Secondary | ICD-10-CM

## 2020-09-06 DIAGNOSIS — L720 Epidermal cyst: Secondary | ICD-10-CM | POA: Diagnosis not present

## 2020-09-06 DIAGNOSIS — B351 Tinea unguium: Secondary | ICD-10-CM

## 2020-09-06 DIAGNOSIS — B353 Tinea pedis: Secondary | ICD-10-CM

## 2020-09-06 DIAGNOSIS — L814 Other melanin hyperpigmentation: Secondary | ICD-10-CM

## 2020-09-06 DIAGNOSIS — L578 Other skin changes due to chronic exposure to nonionizing radiation: Secondary | ICD-10-CM

## 2020-09-06 DIAGNOSIS — D229 Melanocytic nevi, unspecified: Secondary | ICD-10-CM

## 2020-09-06 DIAGNOSIS — L309 Dermatitis, unspecified: Secondary | ICD-10-CM

## 2020-09-06 DIAGNOSIS — I7300 Raynaud's syndrome without gangrene: Secondary | ICD-10-CM | POA: Diagnosis not present

## 2020-09-06 DIAGNOSIS — D1800 Hemangioma unspecified site: Secondary | ICD-10-CM

## 2020-09-06 DIAGNOSIS — L821 Other seborrheic keratosis: Secondary | ICD-10-CM

## 2020-09-06 MED ORDER — MOMETASONE FUROATE 0.1 % EX OINT: TOPICAL_OINTMENT | CUTANEOUS | 3 refills | Status: DC

## 2020-09-06 NOTE — Patient Instructions (Signed)
   Pre-Operative Instructions  You are scheduled for a surgical procedure at [facility]. We recommend you read the following instructions. If you have any questions or concerns, please call the office at [phone].  1. Shower and wash the entire body with soap and water the day of your surgery paying special attention to cleansing at and around the planned surgery site.  2. Avoid aspirin or aspirin containing products at least fourteen (14) days prior to your surgical procedure and for at least one week (7 Days) after your surgical procedure. If you take aspirin on a regular basis for heart disease or history of stroke or for any other reason, we may recommend you continue taking aspirin but please notify us if you take this on a regular basis. Aspirin can cause more bleeding to occur during surgery as well as prolonged bleeding and bruising after surgery.   3. Avoid other nonsteroidal pain medications at least one week prior to surgery and at least one week prior to your surgery. These include medications such as Ibuprofen (Motrin, Advil and Nuprin), Naprosyn, Voltaren, Relafen, etc. If medications are used for therapeutic reasons, please inform us as they can cause increased bleeding or prolonged bleeding during and bruising after surgical procedures.   4. Please advise [place] if you are taking any "blood thinner" medications such as Coumadin or Dipyridamole or Plavix or similar medications. These cause increased bleeding and prolonged bleeding during procedures and bruising after surgical procedures. We may have to consider discontinuing these medications briefly prior to and shortly after your surgery if safe to do so.   5. Please inform us of all medications you are currently taking. All medications that are taken regularly should be taken the day of surgery as you always do. Nevertheless, we need to be informed of what medications you are taking prior to surgery to know whether they will  affect the procedure or cause any complications.   6. Please inform us of any medication allergies. Also inform us of whether you have allergies to Latex or rubber products or whether you have had any adverse reaction to Lidocaine or Epinephrine.  7. Please inform us of any prosthetic or artificial body parts such as artificial heart valve, joint replacements, etc., or similar condition that might require preoperative antibiotics.   8. We recommend avoidance of alcohol at least two weeks prior to surgery and continued avoidance for at least two weeks after surgery.   9. We recommend discontinuation of tobacco smoking at least two weeks prior to surgery and continued abstinence for at least two weeks after surgery.  10. Do not plan strenuous exercise, strenuous work or strenuous lifting for approximately four weeks after your surgery.   11. We request if you are unable to make your scheduled surgical appointment, please call us at least a week in advance or as soon as you are aware of a problem so that we can cancel or reschedule the appointment.   12. You MAY TAKE TYLENOL (acetaminophen) for pain as it is not a blood thinner.   13. PLEASE PLAN TO BE IN TOWN FOR TWO WEEKS FOLLOWING SURGERY, THIS IS IMPORTANT SO YOU CAN BE CHECKED FOR DRESSING CHANGES, SUTURE REMOVAL AND TO MONITOR FOR POSSIBLE COMPLICATIONS.

## 2020-09-06 NOTE — Progress Notes (Signed)
Follow-Up Visit   Subjective  Ralph Brown is a 80 y.o. male who presents for the following: Annual Exam (Hx AK's ). His Raynaud's of his finger tends to flare during the winter.  He is using the topical nifedipine with good results.  He also complains of a rash on his hands with peeling and would like treatment for this. He is currently being treated for fungal infection of his feet and nails with oral Lamisil by Dr. Doy Hutching The patient presents for Total-Body Skin Exam (TBSE) for skin cancer screening and mole check.  The following portions of the chart were reviewed this encounter and updated as appropriate:   Tobacco  Allergies  Meds  Problems  Med Hx  Surg Hx  Fam Hx     Review of Systems:  No other skin or systemic complaints except as noted in HPI or Assessment and Plan.  Objective  Well appearing patient in no apparent distress; mood and affect are within normal limits.  A full examination was performed including scalp, head, eyes, ears, nose, lips, neck, chest, axillae, abdomen, back, buttocks, bilateral upper extremities, bilateral lower extremities, hands, feet, fingers, toes, fingernails, and toenails. All findings within normal limits unless otherwise noted below.  Objective  B/L hands: Clear   Objective  B/L hands: Peeling and fissuring of the finger tips and hands.  Objective  R cheek: 1.0 cm Subcutaneous nodule.   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   Hand dermatitis B/L hands Chronic, persistent -  Start Mometasone 0.1% ointment to aa's QD-BID PRN.  Atopic dermatitis (eczema) is a chronic, relapsing, pruritic condition that can significantly affect quality of life. It is often associated with allergic rhinitis and/or asthma and can require treatment with topical medications, phototherapy, or in severe cases a biologic medication called Dupixent in older  children and adults.   mometasone (ELOCON) 0.1 % ointment - B/L hands  Epidermal inclusion cyst R cheek Benign appearing, discussed surgical excision.  Patient would like to schedule surgery.  Tinea pedis of both feet Left Foot - Anterior With tinea unguium -  Continue Terbinafine as prescribed by Dr. Doy Hutching.    Lentigines - Scattered tan macules - Discussed due to sun exposure - Benign, observe - Call for any changes  Seborrheic Keratoses - Stuck-on, waxy, tan-brown papules and plaques  - Discussed benign etiology and prognosis. - Observe - Call for any changes  Melanocytic Nevi - Tan-brown and/or pink-flesh-colored symmetric macules and papules - Benign appearing on exam today - Observation - Call clinic for new or changing moles - Recommend daily use of broad spectrum spf 30+ sunscreen to sun-exposed areas.   Hemangiomas - Red papules - Discussed benign nature - Observe - Call for any changes  Actinic Damage - Chronic, secondary to cumulative UV/sun exposure - diffuse scaly erythematous macules with underlying dyspigmentation - Recommend daily broad spectrum sunscreen SPF 30+ to sun-exposed areas, reapply every 2 hours as needed.  - Call for new or changing lesions.  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  Return for surgery.  Surgery for cyst of the cheek.  Luther Redo, CMA, am acting as scribe  for Sarina Ser, MD .  Documentation: I have reviewed the above documentation for accuracy and completeness, and I agree with the above.  Sarina Ser, MD

## 2020-09-10 ENCOUNTER — Encounter: Payer: Self-pay | Admitting: Dermatology

## 2020-10-16 ENCOUNTER — Encounter: Payer: Medicare Other | Admitting: Dermatology

## 2020-11-06 ENCOUNTER — Encounter: Payer: Medicare Other | Admitting: Dermatology

## 2020-12-11 ENCOUNTER — Encounter: Payer: Medicare Other | Admitting: Dermatology

## 2021-09-11 ENCOUNTER — Encounter: Payer: Medicare Other | Admitting: Dermatology

## 2021-09-30 ENCOUNTER — Encounter: Payer: Medicare Other | Admitting: Dermatology

## 2021-10-03 ENCOUNTER — Encounter: Payer: Self-pay | Admitting: Dermatology

## 2021-10-09 ENCOUNTER — Encounter: Payer: Medicare Other | Admitting: Dermatology

## 2021-10-21 ENCOUNTER — Ambulatory Visit: Payer: Medicare Other | Admitting: Dermatology

## 2021-10-21 DIAGNOSIS — D229 Melanocytic nevi, unspecified: Secondary | ICD-10-CM

## 2021-10-21 DIAGNOSIS — L820 Inflamed seborrheic keratosis: Secondary | ICD-10-CM

## 2021-10-21 DIAGNOSIS — L80 Vitiligo: Secondary | ICD-10-CM | POA: Diagnosis not present

## 2021-10-21 DIAGNOSIS — L821 Other seborrheic keratosis: Secondary | ICD-10-CM

## 2021-10-21 DIAGNOSIS — Z872 Personal history of diseases of the skin and subcutaneous tissue: Secondary | ICD-10-CM

## 2021-10-21 DIAGNOSIS — L720 Epidermal cyst: Secondary | ICD-10-CM

## 2021-10-21 DIAGNOSIS — L814 Other melanin hyperpigmentation: Secondary | ICD-10-CM

## 2021-10-21 DIAGNOSIS — L570 Actinic keratosis: Secondary | ICD-10-CM

## 2021-10-21 DIAGNOSIS — Z1283 Encounter for screening for malignant neoplasm of skin: Secondary | ICD-10-CM | POA: Diagnosis not present

## 2021-10-21 DIAGNOSIS — L578 Other skin changes due to chronic exposure to nonionizing radiation: Secondary | ICD-10-CM

## 2021-10-21 DIAGNOSIS — D1800 Hemangioma unspecified site: Secondary | ICD-10-CM

## 2021-10-21 MED ORDER — OPZELURA 1.5 % EX CREA: 1.0000 "application " | TOPICAL_CREAM | CUTANEOUS | 2 refills | Status: DC

## 2021-10-21 NOTE — Patient Instructions (Signed)

## 2021-10-22 ENCOUNTER — Encounter: Payer: Self-pay | Admitting: Dermatology

## 2021-10-22 ENCOUNTER — Telehealth: Payer: Self-pay

## 2021-10-22 NOTE — Progress Notes (Signed)
? ?Follow-Up Visit ?  ?Subjective  ?Ralph Brown is a 81 y.o. male who presents for the following: Annual Exam (History of Aks - TBSE today) and Cyst (Of right cheek seems larger - would like to discuss excision). ?The patient presents for Total-Body Skin Exam (TBSE) for skin cancer screening and mole check.  The patient has spots, moles and lesions to be evaluated, some may be new or changing and the patient has concerns that these could be cancer. ? ?The following portions of the chart were reviewed this encounter and updated as appropriate:  ? Tobacco  Allergies  Meds  Problems  Med Hx  Surg Hx  Fam Hx   ?  ?Review of Systems:  No other skin or systemic complaints except as noted in HPI or Assessment and Plan. ? ?Objective  ?Well appearing patient in no apparent distress; mood and affect are within normal limits. ? ?A full examination was performed including scalp, head, eyes, ears, nose, lips, neck, chest, axillae, abdomen, back, buttocks, bilateral upper extremities, bilateral lower extremities, hands, feet, fingers, toes, fingernails, and toenails. All findings within normal limits unless otherwise noted below. ? ?Right cheek ?1.1 cm cystic papule ? ?Left infraocular ?Smooth white papule(s).  ? ?Nose ?Erythematous thin papules/macules with gritty scale.  ? ?Left scalp ?Erythematous stuck-on, waxy papule or plaque ? ?Right Wrist - Anterior ? ? ? ? ? ? ? ? ? ? ? ?Assessment & Plan  ? ?Lentigines ?- Scattered tan macules ?- Due to sun exposure ?- Benign-appearing, observe ?- Recommend daily broad spectrum sunscreen SPF 30+ to sun-exposed areas, reapply every 2 hours as needed. ?- Call for any changes ? ?Seborrheic Keratoses ?- Stuck-on, waxy, tan-brown papules and/or plaques  ?- Benign-appearing ?- Discussed benign etiology and prognosis. ?- Observe ?- Call for any changes ? ?Melanocytic Nevi ?- Tan-brown and/or pink-flesh-colored symmetric macules and papules ?- Benign appearing on exam today ?-  Observation ?- Call clinic for new or changing moles ?- Recommend daily use of broad spectrum spf 30+ sunscreen to sun-exposed areas.  ? ?Hemangiomas ?- Red papules ?- Discussed benign nature ?- Observe ?- Call for any changes ? ?Actinic Damage ?- Chronic condition, secondary to cumulative UV/sun exposure ?- diffuse scaly erythematous macules with underlying dyspigmentation ?- Recommend daily broad spectrum sunscreen SPF 30+ to sun-exposed areas, reapply every 2 hours as needed.  ?- Staying in the shade or wearing long sleeves, sun glasses (UVA+UVB protection) and wide brim hats (4-inch brim around the entire circumference of the hat) are also recommended for sun protection.  ?- Call for new or changing lesions. ? ?Skin cancer screening performed today. ? ?Epidermal inclusion cyst ?Right cheek ?Will plan excision ?Benign-appearing. Exam most consistent with an epidermal inclusion cyst. Discussed that a cyst is a benign growth that can grow over time and sometimes get irritated or inflamed. Recommend observation if it is not bothersome. Discussed option of surgical excision to remove it if it is growing, symptomatic, or other changes noted. Please call for new or changing lesions so they can be evaluated. ? ?[place] ?Left infraocular ?Benign-appearing.  Observation.  Call clinic for new or changing lesions.  Recommend daily use of broad spectrum spf 30+ sunscreen to sun-exposed areas.  ? 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  Region frozen until ice ball extended beyond lesion: Yes   ?Outcome: patient tolerated procedure well with no complications   ?Post-procedure details: wound care instructions given   ? ?Inflamed seborrheic keratosis ?Left scalp ?Destruction of  lesion - Left scalp ?Complexity: simple   ?Destruction method: cryotherapy   ?Informed consent: discussed and consent obtained   ?Timeout:  patient name, date of birth, surgical site, and procedure verified ?Lesion destroyed using liquid nitrogen: Yes   ?Region frozen until ice ball extended beyond lesion: Yes   ?Outcome: patient tolerated procedure well with no complications   ?Post-procedure details: wound care instructions given   ? ?Vitiligo ?Right Wrist - Anterior ?Vitiligo is a chronic autoimmune condition which causes loss of skin pigment and is commonly seen on the face and may also involve areas of trauma like hands, elbows, knees, and ankles. There is no cure and it is difficult to treat.  Treatments include topical steroids and other topical anti-inflammatory ointments/creams and topical and oral Jak inhibitors.  Sometimes narrow band UV light therapy or Xtrac laser is helpful, both of which require twice weekly treatments for at least 3-6 months.  Antioxidant vitamins, such as Vitamins A,C,E,D, Folic Acid and B12 may be added to enhance treatment. ? 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 Labs from 10/04/21 are WNL. ? ?Start Opzelura cream qd-bid ? ?Ruxolitinib Phosphate (OPZELURA) 1.5 % CREA - Right Wrist - Anterior ?Apply 1 application. topically as directed. Qd-bid ? ?Return for Surgery  Cyst of right cheek. ? ?I, Ashok Cordia, CMA, am acting as scribe for Sarina Ser, MD . ?Documentation: I have reviewed the above documentation for accuracy and completeness, and I agree with the above. ? ?Sarina Ser, MD ? ?

## 2021-10-22 NOTE — Telephone Encounter (Signed)
Opzelura not covered under patient's plan. Covered alternantives are Triamcinolone Cream 0.5%, Mometasone Ointment 0.1% or Betamethasone Dip. Cream 0.05%.  ?

## 2021-10-24 MED ORDER — MOMETASONE FUROATE 0.1 % EX CREA: 1.0000 "application " | TOPICAL_CREAM | CUTANEOUS | 0 refills | Status: DC

## 2021-10-24 NOTE — Telephone Encounter (Signed)
RX sent in and left patient detailed VM advising him of change. aw ?

## 2021-10-30 ENCOUNTER — Encounter: Payer: Medicare Other | Admitting: Dermatology

## 2021-12-03 ENCOUNTER — Ambulatory Visit: Payer: Medicare Other | Admitting: Dermatology

## 2021-12-03 ENCOUNTER — Telehealth: Payer: Self-pay

## 2021-12-03 DIAGNOSIS — D492 Neoplasm of unspecified behavior of bone, soft tissue, and skin: Secondary | ICD-10-CM

## 2021-12-03 DIAGNOSIS — L723 Sebaceous cyst: Secondary | ICD-10-CM | POA: Diagnosis not present

## 2021-12-03 MED ORDER — MUPIROCIN 2 % EX OINT: 1.0000 "application " | TOPICAL_OINTMENT | Freq: Every day | CUTANEOUS | 1 refills | Status: DC

## 2021-12-03 NOTE — Telephone Encounter (Signed)
Pt doing well after todays surgery./sh 

## 2021-12-03 NOTE — Patient Instructions (Signed)
Wound Care Instructions ? ?On the day following your surgery, you should begin doing daily dressing changes: ?Remove the old dressing and discard it. ?Cleanse the wound gently with tap water. This may be done in the shower or by placing a wet gauze pad directly on the wound and letting it soak for several minutes. ?It is important to gently remove any dried blood from the wound in order to encourage healing. This may be done by gently rolling a moistened Q-tip on the dried blood. Do not pick at the wound. ?If the wound should start to bleed, continue cleaning the wound, then place a moist gauze pad on the wound and hold pressure for a few minutes.  ?Make sure you then dry the skin surrounding the wound completely or the tape will not stick to the skin. Do not use cotton balls on the wound. ?After the wound is clean and dry, apply the ointment gently with a Q-tip. ?Cut a non-stick pad to fit the size of the wound. Lay the pad flush to the wound. If the wound is draining, you may want to reinforce it with a small amount of gauze on top of the non-stick pad for a little added compression to the area. ?Use the tape to seal the area completely. ?Select from the following with respect to your individual situation: ?If your wound has been stitched closed: continue the above steps 1-8 at least daily until your sutures are removed. ?If your wound has been left open to heal: continue steps 1-8 at least daily for the first 3-4 weeks. ?We would like for you to take a few extra precautions for at least the next week. ?Sleep with your head elevated on pillows if our wound is on your head. ?Do not bend over or lift heavy items to reduce the chance of elevated blood pressure to the wound ?Do not participate in particularly strenuous activities. ? ? ?Below is a list of dressing supplies you might need.  ?Cotton-tipped applicators - Q-tips ?Gauze pads (2x2 and/or 4x4) - All-Purpose Sponges ?Non-stick dressing material - Telfa ?Tape -  Paper or Hypafix ?New and clean tube of petroleum jelly - Vaseline  ? ? ?Comments on Post-Operative Period ?Slight swelling and redness often appear around the wound. This is normal and will disappear within several days following the surgery. ?The healing wound will drain a brownish-red-yellow discharge during healing. This is a normal phase of wound healing. As the wound begins to heal, the drainage may increase in amount. Again, this drainage is normal. ?Notify us if the drainage becomes persistently bloody, excessively swollen, or intensely painful or develops a foul odor or red streaks.  ?If you should experience mild discomfort during the healing phase, you may take an aspirin-free medication such as Tylenol (acetaminophen). Notify us if the discomfort is severe or persistent. Avoid alcoholic beverages when taking pain medicine. ? ?In Case of Wound Hemorrhage ?A wound hemorrhage is when the bandage suddenly becomes soaked with bright red blood and flows profusely. If this happens, sit down or lie down with your head elevated. If the wound has a dressing on it, do not remove the dressing. Apply pressure to the existing gauze. If the wound is not covered, use a gauze pad to apply pressure and continue applying the pressure for 20 minutes without peeking. DO NOT COVER THE WOUND WITH A LARGE TOWEL OR WASH CLOTH. Release your hand from the wound site but do not remove the dressing. If the bleeding has stopped,   gently clean around the wound. Leave the dressing in place for 24 hours if possible. This wait time allows the blood vessels to close off so that you do not spark a new round of bleeding by disrupting the newly clotted blood vessels with an immediate dressing change. If the bleeding does not subside, continue to hold pressure. If matters are out of your control, contact an After Hours clinic or go to the Emergency Room. ? ? ?If You Need Anything After Your Visit ? ?If you have any questions or concerns for  your doctor, please call our main line at [phone] and press option 4 to reach your doctor's medical assistant. If no one answers, please leave a voicemail as directed and we will return your call as soon as possible. Messages left after 4 pm will be answered the following business day.  ? ?You may also send [place] a message via MyChart. We typically respond to MyChart messages within 1-2 business days. ? ?For prescription refills, please ask your pharmacy to contact our office. Our fax number is [phone]. ? ?If you have an urgent issue when the clinic is closed that cannot wait until the next business day, you can page your doctor at the number below.   ? ?Please note that while we do our best to be available for urgent issues outside of office hours, we are not available 24/7.  ? ?If you have an urgent issue and are unable to reach [place], you may choose to seek medical care at your doctor's office, retail clinic, urgent care center, or emergency room. ? ?If you have a medical emergency, please immediately call 911 or go to the emergency department. ? ?Pager Numbers ? ?- Dr. Nehemiah Massed: [phone] ? ?- Dr. Laurence Ferrari: [phone] ? ?- Dr. Nicole Kindred: [phone] ? ?In the event of inclement weather, please call our main line at [phone] for an update on the status of any delays or closures. ? ?Dermatology Medication Tips: ?Please keep the boxes that topical medications come in in order to help keep track of the instructions about where and how to use these. Pharmacies typically print the medication instructions only on the boxes and not directly on the medication tubes.  ? ?If your medication is too expensive, please contact our office at [phone] option 4 or send [place] a message through [place].  ? ?We are unable to tell what your co-pay for medications will be in advance as this is different depending on your insurance coverage. However, we may be able to find a substitute medication at lower cost or fill out  paperwork to get insurance to cover a needed medication.  ? ?If a prior authorization is required to get your medication covered by your insurance company, please allow [place] 1-2 business days to complete this process. ? ?Drug prices often vary depending on where the prescription is filled and some pharmacies may offer cheaper prices. ? ?The website www.goodrx.com contains coupons for medications through different pharmacies. The prices here do not account for what the cost may be with help from insurance (it may be cheaper with your insurance), but the website can give you the price if you did not use any insurance.  ?- You can print the associated coupon and take it with your prescription to the pharmacy.  ?- You may also stop by our office during regular business hours and pick up a GoodRx coupon card.  ?- If you need your prescription sent electronically to a different pharmacy, notify our office  through [facility] or by phone at [phone] option 4. ? ? ? ? ?Si Usted Necesita Algo Despu?s de Su Visita ? ?Tambi?n puede enviarnos un mensaje a trav?s de MyChart. Por lo general respondemos a los mensajes de MyChart en el transcurso de 1 a 2 d?as h?biles. ? ?Para renovar recetas, por favor pida a su farmacia que se ponga en contacto con nuestra oficina. Nuestro n?mero de fax es el [phone]. ? ?Si tiene un asunto urgente cuando la cl?nica est? cerrada y que no puede esperar hasta el siguiente d?a h?bil, puede llamar/localizar a su doctor(a) al n?mero que aparece a continuaci?n.  ? ?Por favor, tenga en cuenta que aunque hacemos todo lo posible para estar disponibles para asuntos urgentes fuera del horario de oficina, no estamos disponibles las 24 horas del d?a, los 7 d?as de la semana.  ? ?Si tiene un problema urgente y no puede comunicarse con nosotros, puede optar por buscar atenci?n m?dica  en el consultorio de su doctor(a), en una cl?nica privada, en un centro de atenci?n urgente o en una sala de  emergencias. ? ?Si tiene Engineer, maintenance (IT) m?dica, por favor llame inmediatamente al 911 o vaya a la sala de emergencias. ? ?N?meros de b?per ? ?- Dr. Nehemiah Massed: [phone] ? ?- Dra. Moye: [phone] ? ?- Dra.

## 2021-12-03 NOTE — Progress Notes (Signed)
   Follow-Up Visit   Subjective  Ralph Brown is a 81 y.o. male who presents for the following: cyst vs other (R cheek, pt presents for excision).  The following portions of the chart were reviewed this encounter and updated as appropriate:   Tobacco  Allergies  Meds  Problems  Med Hx  Surg Hx  Fam Hx     Review of Systems:  No other skin or systemic complaints except as noted in HPI or Assessment and Plan.  Objective  Well appearing patient in no apparent distress; mood and affect are within normal limits.  A focused examination was performed including face. Relevant physical exam findings are noted in the Assessment and Plan.  R cheek Cystic pap 1.1cm   Assessment & Plan  Neoplasm of skin R cheek  Skin excision  Lesion length (cm):  1.1 Lesion width (cm):  1.1 Margin per side (cm):  0 Total excision diameter (cm):  1.1 Informed consent: discussed and consent obtained   Timeout: patient name, date of birth, surgical site, and procedure verified   Procedure prep:  Patient was prepped and draped in usual sterile fashion Prep type:  Isopropyl alcohol and povidone-iodine Anesthesia: the lesion was anesthetized in a standard fashion   Anesthetic:  1% lidocaine w/ epinephrine 1-100,000 buffered w/ 8.4% NaHCO3 (3cc lido w/ epi, 1cc bupivicaine, Total of 4cc) Instrument used comment:  #15c blade Hemostasis achieved with: pressure   Hemostasis achieved with comment:  Electrocautery Outcome: patient tolerated procedure well with no complications   Post-procedure details: sterile dressing applied and wound care instructions given   Dressing type: bandage and bacitracin (Mupirocin)    Skin repair Complexity:  Complex Final length (cm):  2.2 Reason for type of repair: reduce tension to allow closure, reduce the risk of dehiscence, infection, and necrosis, reduce subcutaneous dead space and avoid a hematoma, allow closure of the large defect, preserve normal anatomy, preserve  normal anatomical and functional relationships and enhance both functionality and cosmetic results   Undermining: area extensively undermined   Undermining comment:  Undermining Defect 1.1cm Subcutaneous layers (deep stitches):  Suture size:  5-0 Suture type: Vicryl (polyglactin 910)   Subcutaneous suture technique: Inverted Dermal. Fine/surface layer approximation (top stitches):  Suture size:  5-0 Suture type: nylon   Stitches: simple running   Suture removal (days):  7 Hemostasis achieved with: pressure Outcome: patient tolerated procedure well with no complications   Post-procedure details: sterile dressing applied and wound care instructions given   Dressing type: bandage, pressure dressing and bacitracin (Mupirocin)    mupirocin ointment (BACTROBAN) 2 % Apply 1 application. topically daily. Qd to excision site  Specimen 1 - Surgical pathology Differential Diagnosis: D48.5 Cyst vs other  Check Margins: No Cystic pap 1.1cm  Cyst vs other, excised today  Start Mupirocin oint qd to excision site   Return in about 1 week (around 12/10/2021) for suture removal.  I, Othelia Pulling, RMA, am acting as scribe for Sarina Ser, MD . Documentation: I have reviewed the above documentation for accuracy and completeness, and I agree with the above.  Sarina Ser, MD

## 2021-12-10 ENCOUNTER — Ambulatory Visit: Payer: Medicare Other | Admitting: Dermatology

## 2021-12-11 ENCOUNTER — Ambulatory Visit: Payer: Medicare Other | Admitting: Dermatology

## 2021-12-11 DIAGNOSIS — L720 Epidermal cyst: Secondary | ICD-10-CM

## 2021-12-11 DIAGNOSIS — Z4802 Encounter for removal of sutures: Secondary | ICD-10-CM

## 2021-12-11 NOTE — Progress Notes (Signed)
   Follow-Up Visit   Subjective  Ralph Brown is a 81 y.o. male who presents for the following: Cyst with associated wart virus bx proven (R cheek, pt presents for suture removal).  The following portions of the chart were reviewed this encounter and updated as appropriate:   Tobacco  Allergies  Meds  Problems  Med Hx  Surg Hx  Fam Hx     Review of Systems:  No other skin or systemic complaints except as noted in HPI or Assessment and Plan.  Objective  Well appearing patient in no apparent distress; mood and affect are within normal limits.  A focused examination was performed including right cheek. Relevant physical exam findings are noted in the Assessment and Plan.  Head - Anterior (Face) Healing excision site   Assessment & Plan  Epidermal cyst -status post excision Head - Anterior (Face)  With associated wart virus bx proven  Encounter for Removal of Sutures - Incision site at the right cheek is clean, dry and intact - Wound cleansed, sutures removed, wound cleansed and steri strips applied.  - Discussed pathology results showing verrucous cyst  - Patient advised to keep steri-strips dry until they fall off. - Scars remodel for a full year. - Once steri-strips fall off, patient can apply over-the-counter silicone scar cream each night to help with scar remodeling if desired. - Patient advised to call with any concerns or if they notice any new or changing lesions.   Return in about 1 year (around 12/12/2022) for TBSE, Hx of AKs.  I, Othelia Pulling, RMA, am acting as scribe for Sarina Ser, MD . Documentation: I have reviewed the above documentation for accuracy and completeness, and I agree with the above.  Sarina Ser, MD

## 2021-12-11 NOTE — Patient Instructions (Signed)

## 2021-12-14 ENCOUNTER — Encounter: Payer: Self-pay | Admitting: Dermatology

## 2021-12-16 ENCOUNTER — Encounter: Payer: Self-pay | Admitting: Dermatology

## 2021-12-17 ENCOUNTER — Other Ambulatory Visit: Payer: Self-pay

## 2021-12-17 ENCOUNTER — Emergency Department: Payer: Medicare Other

## 2021-12-17 DIAGNOSIS — I2699 Other pulmonary embolism without acute cor pulmonale: Secondary | ICD-10-CM | POA: Diagnosis not present

## 2021-12-17 DIAGNOSIS — R911 Solitary pulmonary nodule: Secondary | ICD-10-CM | POA: Diagnosis present

## 2021-12-17 DIAGNOSIS — E041 Nontoxic single thyroid nodule: Secondary | ICD-10-CM | POA: Diagnosis present

## 2021-12-17 DIAGNOSIS — R030 Elevated blood-pressure reading, without diagnosis of hypertension: Secondary | ICD-10-CM | POA: Diagnosis present

## 2021-12-17 DIAGNOSIS — Z823 Family history of stroke: Secondary | ICD-10-CM

## 2021-12-17 DIAGNOSIS — Z8673 Personal history of transient ischemic attack (TIA), and cerebral infarction without residual deficits: Secondary | ICD-10-CM

## 2021-12-17 DIAGNOSIS — E039 Hypothyroidism, unspecified: Secondary | ICD-10-CM | POA: Diagnosis present

## 2021-12-17 DIAGNOSIS — E785 Hyperlipidemia, unspecified: Secondary | ICD-10-CM | POA: Diagnosis present

## 2021-12-17 DIAGNOSIS — Z8546 Personal history of malignant neoplasm of prostate: Secondary | ICD-10-CM

## 2021-12-17 DIAGNOSIS — I700 Atherosclerosis of aorta: Secondary | ICD-10-CM | POA: Diagnosis present

## 2021-12-17 DIAGNOSIS — I7300 Raynaud's syndrome without gangrene: Secondary | ICD-10-CM | POA: Diagnosis present

## 2021-12-17 DIAGNOSIS — Z79890 Hormone replacement therapy (postmenopausal): Secondary | ICD-10-CM

## 2021-12-17 DIAGNOSIS — Z7982 Long term (current) use of aspirin: Secondary | ICD-10-CM

## 2021-12-17 DIAGNOSIS — R079 Chest pain, unspecified: Secondary | ICD-10-CM | POA: Diagnosis not present

## 2021-12-17 DIAGNOSIS — Z79899 Other long term (current) drug therapy: Secondary | ICD-10-CM

## 2021-12-17 NOTE — ED Provider Triage Note (Signed)
Emergency Medicine Provider Triage Evaluation Note  Ralph Brown , a 81 y.o. male  was evaluated in triage.  Pt complains of right side rib pain. No specific injury, but did workout yesterday. Pain awakened him this morning and has not gotten any better. Worsens with movement and a deep breath.  Review of Systems  Positive: Rib pain Negative: Shortness of breath  Physical Exam  BP (!) 160/79 (BP Location: Left Arm)   Pulse 62   Temp 98.7 F (37.1 C) (Oral)   Resp 16   Wt 71.2 kg   SpO2 95%   BMI 23.18 kg/m  Gen:   Awake, no distress   Resp:  Normal effort  MSK:   Moves extremities without difficulty  Other:    Medical Decision Making  Medically screening exam initiated at 10:11 PM.  Appropriate orders placed.  Ralph Brown was informed that the remainder of the evaluation will be completed by another provider, this initial triage assessment does not replace that evaluation, and the importance of remaining in the ED until their evaluation is complete.   Victorino Dike, FNP 12/17/21 2214

## 2021-12-17 NOTE — ED Triage Notes (Signed)
Pt arrives with c/o right side rib cage pain that started this morning. Pt denies injury or fall. Per pt, pain does get worse with taking a deep breath.

## 2021-12-18 ENCOUNTER — Inpatient Hospital Stay
Admission: EM | Admit: 2021-12-18 | Discharge: 2021-12-19 | DRG: 176 | Disposition: A | Discharge status: Home / Self Care | Payer: Medicare Other | Attending: Internal Medicine | Admitting: Internal Medicine

## 2021-12-18 ENCOUNTER — Emergency Department: Payer: Medicare Other

## 2021-12-18 ENCOUNTER — Other Ambulatory Visit: Payer: Self-pay

## 2021-12-18 ENCOUNTER — Inpatient Hospital Stay: Payer: Medicare Other

## 2021-12-18 ENCOUNTER — Encounter: Payer: Self-pay | Admitting: Internal Medicine

## 2021-12-18 DIAGNOSIS — Z79890 Hormone replacement therapy (postmenopausal): Secondary | ICD-10-CM | POA: Diagnosis not present

## 2021-12-18 DIAGNOSIS — I10 Essential (primary) hypertension: Secondary | ICD-10-CM

## 2021-12-18 DIAGNOSIS — E041 Nontoxic single thyroid nodule: Secondary | ICD-10-CM | POA: Diagnosis present

## 2021-12-18 DIAGNOSIS — I2609 Other pulmonary embolism with acute cor pulmonale: Secondary | ICD-10-CM | POA: Diagnosis not present

## 2021-12-18 DIAGNOSIS — E785 Hyperlipidemia, unspecified: Secondary | ICD-10-CM | POA: Diagnosis present

## 2021-12-18 DIAGNOSIS — G459 Transient cerebral ischemic attack, unspecified: Secondary | ICD-10-CM | POA: Diagnosis not present

## 2021-12-18 DIAGNOSIS — I7300 Raynaud's syndrome without gangrene: Secondary | ICD-10-CM | POA: Diagnosis present

## 2021-12-18 DIAGNOSIS — I700 Atherosclerosis of aorta: Secondary | ICD-10-CM | POA: Diagnosis present

## 2021-12-18 DIAGNOSIS — R911 Solitary pulmonary nodule: Secondary | ICD-10-CM | POA: Diagnosis present

## 2021-12-18 DIAGNOSIS — Z79899 Other long term (current) drug therapy: Secondary | ICD-10-CM | POA: Diagnosis not present

## 2021-12-18 DIAGNOSIS — Z823 Family history of stroke: Secondary | ICD-10-CM | POA: Diagnosis not present

## 2021-12-18 DIAGNOSIS — E039 Hypothyroidism, unspecified: Secondary | ICD-10-CM | POA: Diagnosis present

## 2021-12-18 DIAGNOSIS — Z8673 Personal history of transient ischemic attack (TIA), and cerebral infarction without residual deficits: Secondary | ICD-10-CM | POA: Diagnosis not present

## 2021-12-18 DIAGNOSIS — R030 Elevated blood-pressure reading, without diagnosis of hypertension: Secondary | ICD-10-CM | POA: Diagnosis present

## 2021-12-18 DIAGNOSIS — I2699 Other pulmonary embolism without acute cor pulmonale: Secondary | ICD-10-CM | POA: Diagnosis present

## 2021-12-18 DIAGNOSIS — Z7982 Long term (current) use of aspirin: Secondary | ICD-10-CM | POA: Diagnosis not present

## 2021-12-18 DIAGNOSIS — Z8546 Personal history of malignant neoplasm of prostate: Secondary | ICD-10-CM | POA: Diagnosis not present

## 2021-12-18 DIAGNOSIS — R079 Chest pain, unspecified: Secondary | ICD-10-CM | POA: Diagnosis present

## 2021-12-18 HISTORY — DX: Hypothyroidism, unspecified: E03.9

## 2021-12-18 HISTORY — DX: Hyperlipidemia, unspecified: E78.5

## 2021-12-18 HISTORY — DX: Raynaud's syndrome without gangrene: I73.00

## 2021-12-18 LAB — BRAIN NATRIURETIC PEPTIDE: B Natriuretic Peptide: 58.7 pg/mL (ref 0.0–100.0)

## 2021-12-18 LAB — BASIC METABOLIC PANEL
Anion gap: 7 (ref 5–15)
BUN: 22 mg/dL (ref 8–23)
CO2: 22 mmol/L (ref 22–32)
Calcium: 9.4 mg/dL (ref 8.9–10.3)
Chloride: 107 mmol/L (ref 98–111)
Creatinine, Ser: 0.83 mg/dL (ref 0.61–1.24)
GFR, Estimated: >60 mL/min (ref >60)
Glucose, Bld: 112 mg/dL — ABNORMAL HIGH (ref 70–99)
Potassium: 4.9 mmol/L (ref 3.5–5.1)
Sodium: 136 mmol/L (ref 135–145)

## 2021-12-18 LAB — CBC
HCT: 51.4 % (ref 39.0–52.0)
Hemoglobin: 17 g/dL (ref 13.0–17.0)
MCH: 30.9 pg (ref 26.0–34.0)
MCHC: 33.1 g/dL (ref 30.0–36.0)
MCV: 93.5 fL (ref 80.0–100.0)
Platelets: 213 10*3/uL (ref 150–400)
RBC: 5.5 MIL/uL (ref 4.22–5.81)
RDW: 13.2 % (ref 11.5–15.5)
WBC: 9.7 10*3/uL (ref 4.0–10.5)
nRBC: 0 % (ref 0.0–0.2)

## 2021-12-18 LAB — PROTIME-INR
INR: 1.1 (ref 0.8–1.2)
Prothrombin Time: 14.1 seconds (ref 11.4–15.2)

## 2021-12-18 LAB — HEPARIN LEVEL (UNFRACTIONATED): Heparin Unfractionated: 0.9 IU/mL — ABNORMAL HIGH (ref 0.30–0.70)

## 2021-12-18 LAB — TROPONIN I (HIGH SENSITIVITY)
Troponin I (High Sensitivity): 9 ng/L (ref <18)
Troponin I (High Sensitivity): 11 ng/L (ref <18)

## 2021-12-18 LAB — APTT: aPTT: 108 seconds — ABNORMAL HIGH (ref 24–36)

## 2021-12-18 MED ORDER — ACETAMINOPHEN 325 MG PO TABS: 650.0000 mg | ORAL_TABLET | Freq: Four times a day (QID) | ORAL | Status: DC | PRN

## 2021-12-18 MED ORDER — TERBINAFINE HCL 250 MG PO TABS
250.0000 mg | ORAL_TABLET | Freq: Every day | ORAL | Status: DC
  Administered 2021-12-18 – 2021-12-19 (×2): 250 mg via ORAL
  Filled 2021-12-18 (×2): qty 1

## 2021-12-18 MED ORDER — HEPARIN BOLUS VIA INFUSION
5000.0000 [IU] | Freq: Once | INTRAVENOUS | Status: AC
  Administered 2021-12-18: 5000 [IU] via INTRAVENOUS
  Filled 2021-12-18: qty 5000

## 2021-12-18 MED ORDER — BRIMONIDINE TARTRATE-TIMOLOL 0.2-0.5 % OP SOLN: 1.0000 [drp] | Freq: Two times a day (BID) | OPHTHALMIC | Status: DC

## 2021-12-18 MED ORDER — ALBUTEROL SULFATE (2.5 MG/3ML) 0.083% IN NEBU: 3.0000 mL | INHALATION_SOLUTION | RESPIRATORY_TRACT | Status: DC | PRN

## 2021-12-18 MED ORDER — OXYCODONE-ACETAMINOPHEN 5-325 MG PO TABS
1.0000 | ORAL_TABLET | ORAL | Status: DC | PRN
  Administered 2021-12-18 – 2021-12-19 (×5): 1 via ORAL
  Filled 2021-12-18 (×5): qty 1

## 2021-12-18 MED ORDER — MORPHINE SULFATE (PF) 4 MG/ML IV SOLN
4.0000 mg | Freq: Once | INTRAVENOUS | Status: AC
  Administered 2021-12-18: 4 mg via INTRAVENOUS
  Filled 2021-12-18: qty 1

## 2021-12-18 MED ORDER — HYDROMORPHONE HCL 1 MG/ML IJ SOLN
0.5000 mg | Freq: Once | INTRAMUSCULAR | Status: AC
  Administered 2021-12-18: 0.5 mg via INTRAVENOUS
  Filled 2021-12-18: qty 0.5

## 2021-12-18 MED ORDER — ACYCLOVIR 200 MG PO CAPS
800.0000 mg | ORAL_CAPSULE | Freq: Every day | ORAL | Status: DC
  Administered 2021-12-18 – 2021-12-19 (×2): 800 mg via ORAL
  Filled 2021-12-18 (×2): qty 4

## 2021-12-18 MED ORDER — LEVOTHYROXINE SODIUM 100 MCG PO TABS
100.0000 ug | ORAL_TABLET | Freq: Every day | ORAL | Status: DC
  Administered 2021-12-19: 100 ug via ORAL
  Filled 2021-12-18: qty 1

## 2021-12-18 MED ORDER — HYDROMORPHONE HCL 1 MG/ML IJ SOLN: 0.5000 mg | Freq: Once | INTRAMUSCULAR | Status: AC

## 2021-12-18 MED ORDER — DM-GUAIFENESIN ER 30-600 MG PO TB12: 1.0000 | ORAL_TABLET | Freq: Two times a day (BID) | ORAL | Status: DC | PRN

## 2021-12-18 MED ORDER — HYDROMORPHONE HCL 1 MG/ML IJ SOLN
INTRAMUSCULAR | Status: AC
  Administered 2021-12-18: 0.5 mg via INTRAVENOUS
  Filled 2021-12-18: qty 0.5

## 2021-12-18 MED ORDER — HYDRALAZINE HCL 20 MG/ML IJ SOLN: 5.0000 mg | INTRAMUSCULAR | Status: DC | PRN

## 2021-12-18 MED ORDER — IOHEXOL 350 MG/ML SOLN
75.0000 mL | Freq: Once | INTRAVENOUS | Status: AC | PRN
  Administered 2021-12-18: 75 mL via INTRAVENOUS

## 2021-12-18 MED ORDER — OYSTER SHELL CALCIUM/D3 500-5 MG-MCG PO TABS
1.0000 | ORAL_TABLET | Freq: Every day | ORAL | Status: DC
  Administered 2021-12-18 – 2021-12-19 (×2): 1 via ORAL
  Filled 2021-12-18 (×2): qty 1

## 2021-12-18 MED ORDER — HEPARIN (PORCINE) 25000 UT/250ML-% IV SOLN
1050.0000 [IU]/h | INTRAVENOUS | Status: DC
  Administered 2021-12-18: 1200 [IU]/h via INTRAVENOUS
  Administered 2021-12-19: 1050 [IU]/h via INTRAVENOUS
  Filled 2021-12-18 (×3): qty 250

## 2021-12-18 MED ORDER — MORPHINE SULFATE (PF) 2 MG/ML IV SOLN
1.0000 mg | INTRAVENOUS | Status: DC | PRN
  Administered 2021-12-18 – 2021-12-19 (×4): 1 mg via INTRAVENOUS
  Filled 2021-12-18 (×4): qty 1

## 2021-12-18 MED ORDER — GLUCOSAMINE-CHONDROITIN 750-600 MG PO TABS: 1.0000 | ORAL_TABLET | Freq: Every day | ORAL | Status: DC

## 2021-12-18 MED ORDER — FISH OIL + D3 1200-1000 MG-UNIT PO CAPS: ORAL_CAPSULE | Freq: Every day | ORAL | Status: DC

## 2021-12-18 MED ORDER — ONDANSETRON HCL 4 MG/2ML IJ SOLN
4.0000 mg | INTRAMUSCULAR | Status: AC
  Administered 2021-12-18: 4 mg via INTRAVENOUS
  Filled 2021-12-18: qty 2

## 2021-12-18 MED ORDER — NETARSUDIL-LATANOPROST 0.02-0.005 % OP SOLN: 1.0000 [drp] | Freq: Every day | OPHTHALMIC | Status: DC

## 2021-12-18 MED ORDER — DORZOLAMIDE HCL 2 % OP SOLN: 1.0000 [drp] | Freq: Two times a day (BID) | OPHTHALMIC | Status: DC

## 2021-12-18 MED ORDER — MEMANTINE HCL 5 MG PO TABS
5.0000 mg | ORAL_TABLET | Freq: Two times a day (BID) | ORAL | Status: DC
  Administered 2021-12-18 – 2021-12-19 (×2): 5 mg via ORAL
  Filled 2021-12-18 (×3): qty 1

## 2021-12-18 MED ORDER — ADULT MULTIVITAMIN W/MINERALS CH
1.0000 | ORAL_TABLET | Freq: Every day | ORAL | Status: DC
  Administered 2021-12-18 – 2021-12-19 (×2): 1 via ORAL
  Filled 2021-12-18 (×2): qty 1

## 2021-12-18 MED ORDER — ONDANSETRON HCL 4 MG/2ML IJ SOLN
4.0000 mg | Freq: Three times a day (TID) | INTRAMUSCULAR | Status: DC | PRN
  Administered 2021-12-18 (×2): 4 mg via INTRAVENOUS
  Filled 2021-12-18 (×2): qty 2

## 2021-12-18 MED ORDER — ASPIRIN 81 MG PO CHEW
81.0000 mg | CHEWABLE_TABLET | Freq: Every day | ORAL | Status: DC
  Administered 2021-12-18 – 2021-12-19 (×2): 81 mg via ORAL
  Filled 2021-12-18 (×2): qty 1

## 2021-12-18 NOTE — Assessment & Plan Note (Signed)
-   Patient is not taking Lipitor currently.  We reviewed his chest CT which did note some abdominal aortic atherosclerosis, so it would benefit him to restart this medication.

## 2021-12-18 NOTE — ED Notes (Signed)
Pt requesting nausea medicine. No PRN orders. Secure chat sent to provider at this time.

## 2021-12-18 NOTE — ED Notes (Signed)
Pt requesting something for nausea. Secure chat sent to MD regarding same. Primary RN made aware.

## 2021-12-18 NOTE — ED Provider Notes (Signed)
[facility] Provider Note    Event Date/Time   First MD Initiated Contact with Patient 12/18/21 [phone]     (approximate)   History   Chief Complaint Rib Cage Pain    HPI  Ralph Brown is a 81 y.o. male with past medical history of migraines and prostate cancer who presents to the ED complaining of chest pain.  Patient reports that yesterday evening he developed mild pain in the right lower lateral portion of his chest.  He states the pain has gradually worsened since then, became significant in the middle of the night when he decided to come to the ED for evaluation.  Pain seems to have acutely worsened then about 1 hour prior to my evaluation.  He describes the pain as constant and sharp, exacerbated when he takes a deep breath.  He denies any recent trauma to his chest or any heavy lifting.  He denies any fevers, cough, difficulty breathing, abdominal pain, pain or swelling in his legs.  He has never had similar symptoms in the past and denies any history of DVT/PE.     Physical Exam   Triage Vital Signs: ED Triage Vitals  Enc Vitals Group     BP 12/17/21 2210 (!) 160/79     Pulse Rate 12/17/21 2210 62     Resp 12/17/21 2210 16     Temp 12/17/21 2210 98.7 F (37.1 C)     Temp Source 12/17/21 2210 Oral     SpO2 12/17/21 2210 95 %     Weight 12/17/21 2210 157 lb (71.2 kg)     Height --      Head Circumference --      Peak Flow --      Pain Score 12/17/21 2210 3     Pain Loc --      Pain Edu? --      Excl. in [place]? --     Most recent vital signs: Vitals:   12/17/21 2210 12/18/21 0435  BP: (!) 160/79 (!) 189/83  Pulse: 62 69  Resp: 16 18  Temp: 98.7 F (37.1 C)   SpO2: 95% 96%    Constitutional: Alert and oriented. Eyes: Conjunctivae are normal. Head: Atraumatic. Nose: No congestion/rhinnorhea. Mouth/Throat: Mucous membranes are moist.  Cardiovascular: Normal rate, regular rhythm. Grossly normal heart sounds.  2+ radial pulses  bilaterally. Respiratory: Normal respiratory effort.  No retractions. Lungs CTAB.  Right chest wall tenderness to palpation noted. Gastrointestinal: Soft and nontender. No distention. Musculoskeletal: No lower extremity tenderness nor edema.  Neurologic:  Normal speech and language. No gross focal neurologic deficits are appreciated.    ED Results / Procedures / Treatments   Labs (all labs ordered are listed, but only abnormal results are displayed) Labs Reviewed  BASIC METABOLIC PANEL - Abnormal; Notable for the following components:      Result Value   Glucose, Bld 112 (*)    All other components within normal limits  CBC  TROPONIN I (HIGH SENSITIVITY)  TROPONIN I (HIGH SENSITIVITY)     EKG  ED ECG REPORT I, Blake Divine, the attending physician, personally viewed and interpreted this ECG.   Date: 12/18/2021  EKG Time: 4:21  Rate: 67  Rhythm: normal sinus rhythm  Axis: Normal  Intervals:first-degree A-V block   ST&T Change: None  RADIOLOGY Chest x-ray reviewed and interpreted by me with no infiltrate, edema, or effusion.  PROCEDURES:  Critical Care performed: Yes, see critical care procedure note(s)  .Critical  Care Performed by: Blake Divine, MD Authorized by: Blake Divine, MD   Critical care provider statement:    Critical care time (minutes):  30   Critical care time was exclusive of:  Separately billable procedures and treating other patients and teaching time   Critical care was necessary to treat or prevent imminent or life-threatening deterioration of the following conditions:  Circulatory failure   Critical care was time spent personally by me on the following activities:  Development of treatment plan with patient or surrogate, discussions with consultants, evaluation of patient's response to treatment, examination of patient, ordering and review of laboratory studies, ordering and review of radiographic studies, ordering and performing treatments  and interventions, pulse oximetry, re-evaluation of patient's condition and review of old charts   I assumed direction of critical care for this patient from another provider in my specialty: no     Care discussed with: admitting provider     MEDICATIONS ORDERED IN ED: Medications  HYDROmorphone (DILAUDID) injection 0.5 mg (has no administration in time range)  morphine (PF) 4 MG/ML injection 4 mg (4 mg Intravenous Given 12/18/21 0447)  iohexol (OMNIPAQUE) 350 MG/ML injection 75 mL (75 mLs Intravenous Contrast Given 12/18/21 0535)  HYDROmorphone (DILAUDID) injection 0.5 mg (0.5 mg Intravenous Given 12/18/21 0557)     IMPRESSION / MDM / [place] / ED COURSE  I reviewed the triage vital signs and the nursing notes.                              81 y.o. male with past medical history of migraines and prostate cancer who presents to the ED complaining of acute onset right chest wall pain exacerbated by deep breath and worsening over the course of the evening.  Patient's presentation is most consistent with acute complicated illness / injury requiring diagnostic workup.  Differential diagnosis includes, but is not limited to, ACS, PE, pneumonia, pneumothorax, dissection, musculoskeletal pain, GERD, cholecystitis.  Patient nontoxic-appearing and in no acute distress, however uncomfortable and clutching the right lower part of his chest.  EKG shows no evidence of arrhythmia or ischemia and symptoms seem atypical for ACS, plan to check 2 sets of troponin.  Chest x-ray obtained from triage is unremarkable and we will further assess with CTA to rule out PE.  He has no abdominal tenderness to suggest biliary pathology.  Additional labs are pending, plan to treat symptomatically with IV morphine and reassess following imaging.  CTA is positive for PE with borderline increased size of RV compared to LV but no pulmonary artery dilatation to clearly indicate right heart strain.  Labs are  reassuring, troponin within normal limits, CBC without anemia or leukocytosis, BMP without electrolyte abnormality or AKI.  Patient continues to complain of significant pain, which we will treat with IV Dilaudid.  Case discussed with hospitalist for admission.       FINAL CLINICAL IMPRESSION(S) / ED DIAGNOSES   Final diagnoses:  Acute pulmonary embolism without acute cor pulmonale, unspecified pulmonary embolism type ([place])     Rx / DC Orders   ED Discharge Orders     None        Note:  This document was prepared using Dragon voice recognition software and may include unintentional dictation errors.   Blake Divine, MD 12/18/21 [phone]

## 2021-12-18 NOTE — Assessment & Plan Note (Addendum)
CT scan showed left lower lobe PE, with slightly elevated RV/LV ratio indicating possible right heart strain injury.  However, BNP and echocardiogram unremarkable.  Patient doing okay with no complaints.  Have started Eliquis which he will need to be on for 6 months.  Unclear etiology.  Doubt genetic given this is his first incident at age 81.  He does have a distant history of prostate cancer and since no other etiology could be determined, would advise PCP to do screening for malignancy for completeness sake.  As needed OxyContin for pain

## 2021-12-18 NOTE — Assessment & Plan Note (Signed)
Blood pressure 189/83, however rest of blood pressures have been stable.  This was likely secondary to pain.  He at one point was taking Norvasc for rainouts phenomenon no previous history of hypertension.

## 2021-12-18 NOTE — Progress Notes (Signed)
ANTICOAGULATION CONSULT NOTE  Pharmacy Consult for heparin infusion Indication: pulmonary embolus  Allergies  Allergen Reactions   Sulfa Antibiotics Other (See Comments)    Unknown reaction- childhood allergy    Patient Measurements: Height: '5\' 9"'$  (175.3 cm) Weight: 71.2 kg (157 lb) IBW/kg (Calculated) : 70.7 Heparin Dosing Weight: 71.2 kg   Vital Signs: BP: 128/64 (05/31 1530) Pulse Rate: 53 (05/31 1530)  Labs: Recent Labs    12/18/21 0425 12/18/21 0618 12/18/21 1503  HGB 17.0  --   --   HCT 51.4  --   --   PLT 213  --   --   APTT  --   --  108*  LABPROT  --   --  14.1  INR  --   --  1.1  HEPARINUNFRC  --   --  0.90*  CREATININE 0.83  --   --   TROPONINIHS 9 11  --      Estimated Creatinine Clearance: 72.2 mL/min (by C-G formula based on SCr of 0.83 mg/dL).   Medical History: Past Medical History:  Diagnosis Date   Actinic keratoses    HLD (hyperlipidemia)    Hypothyroidism    Migraines    Prostate cancer ([place])    Raynaud's syndrome     Assessment: Pt is 81 yo male presenting to ED c/o CP found w/ "small to moderate right lower lobe clot burden and a slightly elevated RV/LV ratio."  Goal of Therapy:  Heparin level 0.3-0.7 units/ml Monitor platelets by anticoagulation protocol: Yes   Plan:  5/31 1503 HL = 0.90, Supratherapeutic  Decrease heparin infusion to 1050 units/hr Will check HL in 8 hrs after rate change CBC daily while on heparin  Dorothe Pea, PharmD, BCPS Clinical Pharmacist   12/18/2021 4:00 PM

## 2021-12-18 NOTE — Assessment & Plan Note (Signed)
Aspirin on hold for 6 months while on Eliquis

## 2021-12-18 NOTE — Progress Notes (Signed)
ANTICOAGULATION CONSULT NOTE  Pharmacy Consult for heparin infusion Indication: pulmonary embolus  Allergies  Allergen Reactions   Sulfa Antibiotics Other (See Comments)    Unknown reaction- childhood allergy    Patient Measurements: Height: '5\' 9"'$  (175.3 cm) Weight: 71.2 kg (157 lb) IBW/kg (Calculated) : 70.7 Heparin Dosing Weight: 71.2 kg   Vital Signs: Temp: 98.7 F (37.1 C) (05/30 2210) Temp Source: Oral (05/30 2210) BP: 189/83 (05/31 0435) Pulse Rate: 69 (05/31 0435)  Labs: Recent Labs    12/18/21 0425  HGB 17.0  HCT 51.4  PLT 213  CREATININE 0.83  TROPONINIHS 9    Estimated Creatinine Clearance: 72.2 mL/min (by C-G formula based on SCr of 0.83 mg/dL).   Medical History: Past Medical History:  Diagnosis Date   Actinic keratoses    Migraines    Prostate cancer ([place])     Assessment: Pt is 81 yo male presenting to ED c/o CP found w/ "small to moderate right lower lobe clot burden and a slightly elevated RV/LV ratio."  Goal of Therapy:  Heparin level 0.3-0.7 units/ml Monitor platelets by anticoagulation protocol: Yes   Plan:  Bolus 5000 units x 1 Start heparin infusion at 1200 units/hr Will check HL in 8 hrs after start of infusion CBC daily while on heparin  Renda Rolls, PharmD, [facility] 12/18/2021 6:45 AM

## 2021-12-18 NOTE — Assessment & Plan Note (Signed)
Patient will resume his home Synthroid.

## 2021-12-18 NOTE — H&P (Signed)
History and Physical    Ralph Brown WJX:[id number] DOB: Jul 20, 1941 DOA: 12/18/2021  Referring MD/NP/PA:   PCP: Idelle Crouch, MD   Patient coming from:  The patient is coming from home.  At baseline, pt is independent for most of ADL.        Chief Complaint: Chest pain  HPI: Ralph Brown is a 81 y.o. male with medical history significant of hyperlipidemia, TIA, hypothyroidism, prostate cancer, migraine, Raynaud's syndrome, who presents with chest pain.  Patient states that his chest pain started yesterday, which is located in the right lower chest, constant, sharp, initially 8 out of 10 in severity, currently 6 out of 10 in severity, pleuritic, aggravated by deep breath.  Denies shortness breath, cough, fever or chills.  Patient has nausea, no vomiting, diarrhea or abdominal pain.  No symptoms of UTI.  Denies tenderness in the calf areas.  No recent long distance traveling.  Patient denies recent fall or head injury.  No rectal bleeding or dark stool.   Data Reviewed and ED Course: pt was found to have WBC 9.7, troponin level 9, 10, renal function okay, temperature normal, blood pressure 189/83, 128/61, heart rate 69, RR 18, oxygen saturation 96% on room air.  Lower extremity venous Doppler negative for DVT.  CT angiogram showed right lower lobe PE with slightly elevated RV/LV ratio.  Patient is admitted to PCU as inpatient.  CTA: 1. Small to moderate right lower lobe clot burden and a slightly elevated RV/LV ratio. 2. Cardiomegaly. 3. Aortic atherosclerosis. 4. Right lower lobe basal opacities most likely due to evolving infarcts, with small consolidation or fibrosis in the right middle lobe base. 5. 4.5 mm left upper lobe nodule. No routine follow-up imaging is recommended per Fleischner Society Guidelines. These guidelines do not apply to immunocompromised patients and patients with cancer. Follow up in patients with significant comorbidities as clinically warranted. For lung  cancer screening, adhere to Lung-RADS guidelines. Reference: Radiology. 2017; 284(1):228-43. 6. 1.5 cm hepatic cyst and additional too small to characterize hepatic hypodensities. 7. Small hiatal hernia. 8. Osteopenia and degenerative change. 9. 1.6 cm nodule in the left lobe of the thyroid gland. Follow-up ultrasound recommended. Evidence of prior surgery to the right lobe. 10. Results telephoned to Dr. Charna Archer at 6:11 a.m., 12/18/2021.    EKG: I have personally reviewed.  Sinus rhythm, QTc 416, possible LAE, early R wave progression.   Review of Systems:   General: no fevers, chills, no body weight gain, fatigue HEENT: no blurry vision, hearing changes or sore throat Respiratory: no dyspnea, coughing, wheezing CV: has chest pain, no palpitations GI: no nausea, vomiting, abdominal pain, diarrhea, constipation GU: no dysuria, burning on urination, increased urinary frequency, hematuria  Ext: no leg edema Neuro: no unilateral weakness, numbness, or tingling, no vision change or hearing loss Skin: no rash, no skin tear. MSK: No muscle spasm, no deformity, no limitation of range of movement in spin Heme: No easy bruising.  Travel history: No recent long distant travel.   Allergy:  Allergies  Allergen Reactions   Sulfa Antibiotics Other (See Comments)    Unknown reaction- childhood allergy    Past Medical History:  Diagnosis Date   Actinic keratoses    HLD (hyperlipidemia)    Hypothyroidism    Migraines    Prostate cancer ([place])    Raynaud's syndrome     Past Surgical History:  Procedure Laterality Date   PROSTATE SURGERY     THYROID SURGERY  TONSILLECTOMY      Social History:  reports that he has never smoked. He has never used smokeless tobacco. He reports that he does not currently use alcohol. He reports that he does not use drugs.  Family History:  Family History  Problem Relation Age of Onset   Intracerebral hemorrhage Father      Prior to Admission  medications   Medication Sig Start Date End Date Taking? Authorizing Provider  acyclovir (ZOVIRAX) 800 MG tablet Take 1 tablet by mouth daily. 01/16/16   [provider]  amLODipine (NORVASC) 5 MG tablet Take by mouth. 07/12/19   [provider]  aspirin 81 MG chewable tablet Chew 1 tablet (81 mg total) by mouth daily. 01/27/16   Loletha Grayer, MD  atorvastatin (LIPITOR) 40 MG tablet Take 1 tablet (40 mg total) by mouth daily. Patient not taking: Reported on 09/06/2020 01/27/16   Theodoro Grist, MD  brimonidine-timolol (COMBIGAN) 0.2-0.5 % ophthalmic solution 1 drop 2 (two) times daily    [provider]  Calcium Carbonate-Vitamin D 600-400 MG-UNIT tablet Take by mouth.    [provider]  dorzolamide (TRUSOPT) 2 % ophthalmic solution 1 drop 2 (two) times daily. 07/18/19   [provider]  Fish Oil-Cholecalciferol (FISH OIL + D3 PO) Take 1 capsule by mouth daily.    [provider]  Glucosamine-Chondroitin 750-600 MG TABS Take 1 tablet by mouth daily.    [provider]  levothyroxine (SYNTHROID, LEVOTHROID) 100 MCG tablet Take 100 mcg by mouth daily before breakfast.    [provider]  memantine (NAMENDA) 5 MG tablet Take 5 mg by mouth 2 (two) times daily.    [provider]  mometasone (ELOCON) 0.1 % cream Apply 1 application. topically as directed. 1 daily up to 5 days per week (M-F) to aa skin color loss 10/24/21   Ralene Bathe, MD  Multiple Vitamin (MULTIVITAMIN) capsule Take 1 capsule by mouth daily.    [provider]  Multiple Vitamins-Minerals (AIRBORNE PO) Take by mouth.    [provider]  mupirocin ointment (BACTROBAN) 2 % Apply 1 application. topically daily. Qd to excision site 12/03/21   Ralene Bathe, MD  NON FORMULARY Med Name: nifedipine 4% ointment on fingertips at bedtime in cold weather    [provider]  ROCKLATAN 0.02-0.005 % SOLN  08/16/19   [provider]  Ruxolitinib Phosphate (OPZELURA) 1.5 % CREA Apply 1 application. topically as directed. Qd-bid 10/21/21   Ralene Bathe, MD  terbinafine (LAMISIL) 250 MG tablet Take 250 mg by mouth daily.    [provider]    Physical Exam: Vitals:   12/18/21 1630 12/18/21 1656 12/18/21 1730 12/18/21 1800  BP: (!) 103/56  127/72 125/60  Pulse: 60  62 64  Resp: '10  17 18  '$ Temp:  98 F (36.7 C)    TempSrc:      SpO2: 93%  94% 94%  Weight:      Height:       General: Not in acute distress HEENT:       Eyes: PERRL, EOMI, no scleral icterus.       ENT: No discharge from the ears and nose, no pharynx injection, no tonsillar enlargement.        Neck: No JVD, no bruit, no mass felt. Heme: No neck lymph node enlargement. Cardiac: S1/S2, RRR, No murmurs, No gallops or rubs. Respiratory: No rales, wheezing, rhonchi or rubs. GI: Soft, nondistended, nontender, no rebound  pain, no organomegaly, BS present. GU: No hematuria Ext: No pitting leg edema bilaterally. 1+DP/PT pulse bilaterally. Musculoskeletal: No joint deformities, No joint redness or warmth, no limitation of ROM in spin. Skin: No rashes.  Neuro: Alert, oriented X3, cranial nerves II-XII grossly intact, moves all extremities normally.  Psych: Patient is not psychotic, no suicidal or hemocidal ideation.  Labs on Admission: I have personally reviewed following labs and imaging studies  CBC: Recent Labs  Lab 12/18/21 0425  WBC 9.7  HGB 17.0  HCT 51.4  MCV 93.5  PLT 203   Basic Metabolic Panel: Recent Labs  Lab 12/18/21 0425  NA 136  K 4.9  CL 107  CO2 22  GLUCOSE 112*  BUN 22  CREATININE 0.83  CALCIUM 9.4   GFR: Estimated Creatinine Clearance: 72.2 mL/min (by C-G formula based on SCr of 0.83 mg/dL). Liver Function Tests: No results for input(s): AST, ALT, ALKPHOS, BILITOT, PROT, ALBUMIN in the last 168 hours. No results for input(s): LIPASE, AMYLASE in the last 168 hours. No results for input(s): AMMONIA in  the last 168 hours. Coagulation Profile: Recent Labs  Lab 12/18/21 1503  INR 1.1   Cardiac Enzymes: No results for input(s): CKTOTAL, CKMB, CKMBINDEX, TROPONINI in the last 168 hours. BNP (last 3 results) No results for input(s): PROBNP in the last 8760 hours. HbA1C: No results for input(s): HGBA1C in the last 72 hours. CBG: No results for input(s): GLUCAP in the last 168 hours. Lipid Profile: No results for input(s): CHOL, HDL, LDLCALC, TRIG, CHOLHDL, LDLDIRECT in the last 72 hours. Thyroid Function Tests: No results for input(s): TSH, T4TOTAL, FREET4, T3FREE, THYROIDAB in the last 72 hours. Anemia Panel: No results for input(s): VITAMINB12, FOLATE, FERRITIN, TIBC, IRON, RETICCTPCT in the last 72 hours. Urine analysis:    Component Value Date/Time   COLORURINE YELLOW (A) 01/27/2016 0910   APPEARANCEUR CLEAR (A) 01/27/2016 0910   LABSPEC 1.010 01/27/2016 0910   PHURINE 6.0 01/27/2016 0910   GLUCOSEU NEGATIVE 01/27/2016 0910   HGBUR NEGATIVE 01/27/2016 0910   BILIRUBINUR NEGATIVE 01/27/2016 0910   KETONESUR NEGATIVE 01/27/2016 0910   PROTEINUR NEGATIVE 01/27/2016 0910   NITRITE NEGATIVE 01/27/2016 0910   LEUKOCYTESUR NEGATIVE 01/27/2016 0910   Sepsis Labs: '@LABRCNTIP'$ (procalcitonin:4,lacticidven:4) )No results found for this or any previous visit (from the past 240 hour(s)).   Radiological Exams on Admission: DG Ribs Unilateral W/Chest Right  Result Date: 12/17/2021 CLINICAL DATA:  pain without injury EXAM: RIGHT RIBS AND CHEST - 3+ VIEW COMPARISON:  None Available. FINDINGS: No acute displaced fracture or other bone lesions are seen involving the right ribs. The heart and mediastinal contours are within normal limits. Left base linear atelectasis. No focal consolidation. No pulmonary edema. No pleural effusion. No pneumothorax. No acute osseous abnormality. IMPRESSION: 1. No acute displaced right rib fracture. Please note, nondisplaced rib fractures may be occult on  radiograph. 2. No acute cardiopulmonary abnormality. Electronically Signed   By: Iven Finn M.D.   On: 12/17/2021 22:47   CT Angio Chest PE W/Cm &/Or Wo Cm  Result Date: 12/18/2021 CLINICAL DATA:  Right chest pain. EXAM: CT ANGIOGRAPHY CHEST WITH CONTRAST TECHNIQUE: Multidetector CT imaging of the chest was performed using the standard protocol during bolus administration of intravenous contrast. Multiplanar CT image reconstructions and MIPs were obtained to evaluate the vascular anatomy. RADIATION DOSE REDUCTION: This exam was performed according to the departmental dose-optimization program which includes automated exposure control, adjustment of the mA and/or kV according to patient size and/or use  of iterative reconstruction technique. CONTRAST:  57m OMNIPAQUE IOHEXOL 350 MG/ML SOLN COMPARISON:  Rib series with a PA chest yesterday. FINDINGS: Cardiovascular: The pulmonary arteries are normal in caliber. There is small to moderate clot burden in the right lower lobe, with filling defect in the distal right lower lobe main artery and partially occlusive thrombus extending from this into 3 basally directed segmental and multiple subsegmental downstream arterial branches. Evidence suggesting at least early right heart strain with slightly elevated RV/LV ratio of 1.1. There is no pericardial effusion. There is no visible coronary artery calcification. There is mild aortic atherosclerosis without aneurysm or dissection. The great vessels branch normally. The pulmonary veins are decompressed. Mediastinum/Nodes: There are surgical clips alongside an asymmetrically smaller right lobe of the thyroid gland compatible with a partial right hemithyroidectomy. There is a heterogeneous 1.6 cm nodule in the posterior aspect of the left lobe. Axillary spaces are clear. There is no intrathoracic adenopathy. There is no tracheal mass. No esophageal thickening is seen. Small hiatal hernia. Lungs/Pleura: There is a minimal  right and trace left pleural effusions. Interstitial and patchy ground-glass opacity in the right lower lobe basal segments is noted most likely relating to evolving infarctions. There is posterior atelectasis in the lingular base and left lower lobe linear scar-like opacities in the right middle lobe a small consolidation versus focal fibrosis in the right middle lobe anterior extreme base. Remainder of both lungs clear. There is a 4.5 mm noncalcified pulmonary nodule peripherally in the lingula on series 7 axial 75. There are no other appreciable nodules. Upper Abdomen: No acute abnormality. There is a 1.5 cm cyst of 9.2 Hounsfield units in the anterior segment of the right lobe of the liver and a few tiny too small to characterize hypodensities in the remaining liver. The pancreas moderately atrophic. Musculoskeletal: There are multilevel degenerative discs, osteopenia and spondylosis thoracic spine mild thoracic kypho dextroscoliosis. No chest wall abnormality or destructive bone lesion. Review of the MIP images confirms the above findings. IMPRESSION: 1. Small to moderate right lower lobe clot burden and a slightly elevated RV/LV ratio. 2. Cardiomegaly. 3. Aortic atherosclerosis. 4. Right lower lobe basal opacities most likely due to evolving infarcts, with small consolidation or fibrosis in the right middle lobe base. 5. 4.5 mm left upper lobe nodule. No routine follow-up imaging is recommended per Fleischner Society Guidelines. These guidelines do not apply to immunocompromised patients and patients with cancer. Follow up in patients with significant comorbidities as clinically warranted. For lung cancer screening, adhere to Lung-RADS guidelines. Reference: Radiology. 2017; 284(1):228-43. 6. 1.5 cm hepatic cyst and additional too small to characterize hepatic hypodensities. 7. Small hiatal hernia. 8. Osteopenia and degenerative change. 9. 1.6 cm nodule in the left lobe of the thyroid gland. Follow-up  ultrasound recommended. Evidence of prior surgery to the right lobe. 10. Results telephoned to Dr. JCharna Archerat 6:11 a.m., 12/18/2021. Electronically Signed   By: KTelford NabM.D.   On: 12/18/2021 06:23   UKoreaVenous Img Lower Bilateral (DVT)  Result Date: 12/18/2021 CLINICAL DATA:  Pulmonary embolism EXAM: BILATERAL LOWER EXTREMITY VENOUS DOPPLER ULTRASOUND TECHNIQUE: Gray-scale sonography with graded compression, as well as color Doppler and duplex ultrasound were performed to evaluate the lower extremity deep venous systems from the level of the common femoral vein and including the common femoral, femoral, profunda femoral, popliteal and calf veins including the posterior tibial, peroneal and gastrocnemius veins when visible. The superficial great saphenous vein was also interrogated. Spectral Doppler was utilized to  evaluate flow at rest and with distal augmentation maneuvers in the common femoral, femoral and popliteal veins. COMPARISON:  None Available. FINDINGS: RIGHT LOWER EXTREMITY Common Femoral Vein: No evidence of thrombus. Normal compressibility, respiratory phasicity and response to augmentation. Saphenofemoral Junction: No evidence of thrombus. Normal compressibility and flow on color Doppler imaging. Profunda Femoral Vein: No evidence of thrombus. Normal compressibility and flow on color Doppler imaging. Femoral Vein: No evidence of thrombus. Normal compressibility, respiratory phasicity and response to augmentation. Popliteal Vein: No evidence of thrombus. Normal compressibility, respiratory phasicity and response to augmentation. Calf Veins: No evidence of thrombus, though note that the peroneal vein was suboptimally evaluated. Normal compressibility and flow on color Doppler imaging. Superficial Great Saphenous Vein: No evidence of thrombus. Normal compressibility. Venous Reflux:  None. Other Findings:  None. LEFT LOWER EXTREMITY Common Femoral Vein: No evidence of thrombus. Normal  compressibility, respiratory phasicity and response to augmentation. Saphenofemoral Junction: No evidence of thrombus. Normal compressibility and flow on color Doppler imaging. Profunda Femoral Vein: No evidence of thrombus. Normal compressibility and flow on color Doppler imaging. Femoral Vein: No evidence of thrombus. Normal compressibility, respiratory phasicity and response to augmentation. Popliteal Vein: No evidence of thrombus. Normal compressibility, respiratory phasicity and response to augmentation. Calf Veins: No evidence of thrombus. Normal compressibility and flow on color Doppler imaging. Superficial Great Saphenous Vein: No evidence of thrombus. Normal compressibility. Venous Reflux:  None. Other Findings:  None. IMPRESSION: No evidence of deep venous thrombosis in either lower extremity. Electronically Signed   By: Valetta Mole M.D.   On: 12/18/2021 10:48      Assessment/Plan Principal Problem:   Acute pulmonary embolism (HCC) Active Problems:   TIA (transient ischemic attack)   Elevated blood pressure reading   HLD (hyperlipidemia)   Hypothyroidism   Principal Problem:   Acute pulmonary embolism (HCC) Active Problems:   TIA (transient ischemic attack)   Elevated blood pressure reading   HLD (hyperlipidemia)   Hypothyroidism   Assessment and Plan: * Acute pulmonary embolism (HCC) CT scan showed left lower lobe PE, with slightly elevated RV/LV ratio indicating possible right heart strain injury  -Admitted to PCU -IV heparin -As needed Percocet and morphine for chest pain -Check BNP -Follow-up 2D echo  Hypothyroidism - Synthroid  HLD (hyperlipidemia) - Patient is not taking Lipitor currently  Elevated blood pressure reading Blood pressure 189/83, 128/61, may be due to pain.  Patient states that she used to take amlodipine which wa forl Raynaud's syndrome, not for hypertension.  She does not have history of hypertension. -IV hydralazine as needed  TIA  (transient ischemic attack) - Aspirin -Patient is not taking Lipitor currently             DVT ppx: on IV Heparin      Code Status: Full code  Family Communication: Yes, patient's wife    at bed side.    Disposition Plan:  Anticipate discharge back to previous environment  Consults called:  none  Admission status and Level of care: Progressive:     as inpt       Severity of Illness:  The appropriate patient status for this patient is INPATIENT. Inpatient status is judged to be reasonable and necessary in order to provide the required intensity of service to ensure the patient's safety. The patient's presenting symptoms, physical exam findings, and initial radiographic and laboratory data in the context of their chronic comorbidities is felt to place them at high risk for further clinical deterioration. Furthermore, it  is not anticipated that the patient will be medically stable for discharge from the hospital within 2 midnights of admission.   * I certify that at the point of admission it is my clinical judgment that the patient will require inpatient hospital care spanning beyond 2 midnights from the point of admission due to high intensity of service, high risk for further deterioration and high frequency of surveillance required.*       Date of Service 12/18/2021    [facility]   If 7PM-7AM, please contact night-coverage [email or web address] 12/18/2021, 7:16 PM

## 2021-12-18 NOTE — ED Notes (Signed)
Pt took his own eye drops and will not need any of those for this evening.

## 2021-12-19 ENCOUNTER — Other Ambulatory Visit (HOSPITAL_COMMUNITY): Payer: Self-pay

## 2021-12-19 ENCOUNTER — Inpatient Hospital Stay
Admit: 2021-12-19 | Discharge: 2021-12-19 | Disposition: A | Discharge status: Home / Self Care | Payer: Medicare Other | Attending: Internal Medicine | Admitting: Internal Medicine

## 2021-12-19 DIAGNOSIS — R911 Solitary pulmonary nodule: Secondary | ICD-10-CM

## 2021-12-19 DIAGNOSIS — I2699 Other pulmonary embolism without acute cor pulmonale: Secondary | ICD-10-CM

## 2021-12-19 DIAGNOSIS — E041 Nontoxic single thyroid nodule: Secondary | ICD-10-CM

## 2021-12-19 LAB — CBC
HCT: 46.1 % (ref 39.0–52.0)
Hemoglobin: 15.5 g/dL (ref 13.0–17.0)
MCH: 31.3 pg (ref 26.0–34.0)
MCHC: 33.6 g/dL (ref 30.0–36.0)
MCV: 92.9 fL (ref 80.0–100.0)
Platelets: 200 10*3/uL (ref 150–400)
RBC: 4.96 MIL/uL (ref 4.22–5.81)
RDW: 13.3 % (ref 11.5–15.5)
WBC: 9 10*3/uL (ref 4.0–10.5)
nRBC: 0 % (ref 0.0–0.2)

## 2021-12-19 LAB — ECHOCARDIOGRAM COMPLETE
AR max vel: 3.31 cm2
AV Area VTI: 3.01 cm2
AV Area mean vel: 2.7 cm2
AV Mean grad: 3 mmHg
AV Peak grad: 4.1 mmHg
Ao pk vel: 1.01 m/s
Area-P 1/2: 2.51 cm2
Height: 69 in
MV VTI: 2.23 cm2
S' Lateral: 2.31 cm

## 2021-12-19 LAB — GLUCOSE, CAPILLARY
Glucose-Capillary: 113 mg/dL — ABNORMAL HIGH (ref 70–99)
Glucose-Capillary: 142 mg/dL — ABNORMAL HIGH (ref 70–99)
Glucose-Capillary: 99 mg/dL (ref 70–99)

## 2021-12-19 LAB — HEPARIN LEVEL (UNFRACTIONATED)
Heparin Unfractionated: 0.57 IU/mL (ref 0.30–0.70)
Heparin Unfractionated: 0.54 IU/mL (ref 0.30–0.70)

## 2021-12-19 MED ORDER — APIXABAN 5 MG PO TABS
10.0000 mg | ORAL_TABLET | Freq: Two times a day (BID) | ORAL | Status: DC
  Administered 2021-12-19: 10 mg via ORAL
  Filled 2021-12-19: qty 2

## 2021-12-19 MED ORDER — OXYCODONE HCL 5 MG PO CAPS: 5.0000 mg | ORAL_CAPSULE | ORAL | 0 refills | Status: DC | PRN

## 2021-12-19 MED ORDER — APIXABAN 5 MG PO TABS: 5.0000 mg | ORAL_TABLET | Freq: Two times a day (BID) | ORAL | Status: DC

## 2021-12-19 MED ORDER — APIXABAN (ELIQUIS) VTE STARTER PACK (10MG AND 5MG)
ORAL_TABLET | ORAL | 0 refills | Status: DC
Start: 2021-12-19 — End: 2023-03-04

## 2021-12-19 NOTE — Progress Notes (Signed)
ANTICOAGULATION CONSULT NOTE  Pharmacy Consult for heparin infusion Indication: pulmonary embolus  Allergies  Allergen Reactions   Sulfa Antibiotics Other (See Comments)    Unknown reaction- childhood allergy    Patient Measurements: Height: '5\' 9"'$  (175.3 cm) Weight: 71.2 kg (157 lb) IBW/kg (Calculated) : 70.7 Heparin Dosing Weight: 71.2 kg   Vital Signs: Temp: 97.8 F (36.6 C) (06/01 0012) BP: 113/51 (06/01 0012) Pulse Rate: 58 (06/01 0012)  Labs: Recent Labs    12/18/21 0425 12/18/21 0618 12/18/21 1503 12/19/21 0036  HGB 17.0  --   --   --   HCT 51.4  --   --   --   PLT 213  --   --   --   APTT  --   --  108*  --   LABPROT  --   --  14.1  --   INR  --   --  1.1  --   HEPARINUNFRC  --   --  0.90* 0.57  CREATININE 0.83  --   --   --   TROPONINIHS 9 11  --   --      Estimated Creatinine Clearance: 72.2 mL/min (by C-G formula based on SCr of 0.83 mg/dL).   Medical History: Past Medical History:  Diagnosis Date   Actinic keratoses    HLD (hyperlipidemia)    Hypothyroidism    Migraines    Prostate cancer ([place])    Raynaud's syndrome     Assessment: Pt is 81 yo male presenting to ED c/o CP found w/ "small to moderate right lower lobe clot burden and a slightly elevated RV/LV ratio."  Goal of Therapy:  Heparin level 0.3-0.7 units/ml Monitor platelets by anticoagulation protocol: Yes   Plan:  6/1 0036 HL = 0.57, therapeutic x 1 Continue heparin infusion at 1050 units/hr Recheck HL in 8 hrs to confirm CBC daily while on heparin  Renda Rolls, PharmD, [facility] 12/19/2021 2:06 AM

## 2021-12-19 NOTE — Hospital Course (Addendum)
81 year old male with past medical history of hyperlipidemia, hypothyroidism, prostate cancer and TIA presented to the emergency room on 5/31 with complaints of right lower chest pain x1 day.  Work-up revealed right lower lobe PE by CT angiogram was slightly elevated RV/LV ratio patient not hypoxic.  BNP within normal limits.  Echocardiogram unremarkable.

## 2021-12-19 NOTE — Progress Notes (Signed)
*  PRELIMINARY RESULTS* Echocardiogram 2D Echocardiogram has been performed.  Ralph Brown 12/19/2021, 11:39 AM

## 2021-12-19 NOTE — TOC Benefit Eligibility Note (Signed)
Patient Advocate Encounter ? ?Insurance verification completed.   ? ?The patient is currently admitted and upon discharge could be taking Eliquis 5 mg. ? ?The current 30 day co-pay is, $47.00.  ? ?The patient is currently admitted and upon discharge could be taking Xarelto 20 mg. ? ?The current 30 day co-pay is, $47.00.  ? ?The patient is insured through [employer] Medicare Part D  ? ? ? ?Annikah Lovins, CPhT ?Pharmacy Patient Advocate Specialist ?[facility] Patient Advocate Team ?Direct Number: [phone]  Fax: [phone] ? ? ? ? ? ?  ?

## 2021-12-19 NOTE — Care Management Important Message (Signed)
Important Message  Patient Details  Name: Ralph Brown MRN: [id number] Date of Birth: 08-08-1940   Medicare Important Message Given:  N/A - LOS <3 / Initial given by admissions     Dannette Barbara 12/19/2021, 2:52 PM

## 2021-12-19 NOTE — Progress Notes (Signed)
ANTICOAGULATION CONSULT NOTE  Pharmacy Consult for heparin infusion Indication: pulmonary embolus  Allergies  Allergen Reactions   Sulfa Antibiotics Other (See Comments)    Unknown reaction- childhood allergy    Patient Measurements: Height: '5\' 9"'$  (175.3 cm) Weight: 71.2 kg (157 lb) IBW/kg (Calculated) : 70.7 Heparin Dosing Weight: 71.2 kg   Vital Signs: Temp: 97.4 F (36.3 C) (06/01 0406) BP: 126/62 (06/01 0406) Pulse Rate: 60 (06/01 0406)  Labs: Recent Labs    12/18/21 0425 12/18/21 0618 12/18/21 1503 12/19/21 0036 12/19/21 0504 12/19/21 0850  HGB 17.0  --   --   --  15.5  --   HCT 51.4  --   --   --  46.1  --   PLT 213  --   --   --  200  --   APTT  --   --  108*  --   --   --   LABPROT  --   --  14.1  --   --   --   INR  --   --  1.1  --   --   --   HEPARINUNFRC  --   --  0.90* 0.57  --  0.54  CREATININE 0.83  --   --   --   --   --   TROPONINIHS 9 11  --   --   --   --      Estimated Creatinine Clearance: 72.2 mL/min (by C-G formula based on SCr of 0.83 mg/dL).   Medical History: Past Medical History:  Diagnosis Date   Actinic keratoses    HLD (hyperlipidemia)    Hypothyroidism    Migraines    Prostate cancer ([place])    Raynaud's syndrome     Assessment: Pt is 81 yo male presenting to ED c/o CP found w/ "small to moderate right lower lobe clot burden and a slightly elevated RV/LV ratio."  6/1 08:50 HL 0.54   Goal of Therapy:  Heparin level 0.3-0.7 units/ml Monitor platelets by anticoagulation protocol: Yes   Plan:  Heparin level is therapeutic. Will continue heparin infusion at 1050 units/hr. Recheck heparin level and CBC with AM labs.   Oswald Hillock, PharmD, BCPS Clinical Pharmacist   12/19/2021 9:39 AM

## 2021-12-19 NOTE — Plan of Care (Signed)
INITIAL ADD ? ? ?Problem: Education: ?Goal: Knowledge of General Education information will improve ?Description: Including pain rating scale, medication(s)/side effects and non-pharmacologic comfort measures ?Outcome: Not Applicable ?  ?Problem: Health Behavior/Discharge Planning: ?Goal: Ability to manage health-related needs will improve ?Outcome: Not Applicable ?  ?Problem: Clinical Measurements: ?Goal: Ability to maintain clinical measurements within normal limits will improve ?Outcome: Not Applicable ?Goal: Will remain free from infection ?Outcome: Not Applicable ?Goal: Diagnostic test results will improve ?Outcome: Not Applicable ?Goal: Respiratory complications will improve ?Outcome: Not Applicable ?Goal: Cardiovascular complication will be avoided ?Outcome: Not Applicable ?  ?Problem: Activity: ?Goal: Risk for activity intolerance will decrease ?Outcome: Not Applicable ?  ?Problem: Nutrition: ?Goal: Adequate nutrition will be maintained ?Outcome: Not Applicable ?  ?Problem: Coping: ?Goal: Level of anxiety will decrease ?Outcome: Not Applicable ?  ?Problem: Elimination: ?Goal: Will not experience complications related to bowel motility ?Outcome: Not Applicable ?Goal: Will not experience complications related to urinary retention ?Outcome: Not Applicable ?  ?Problem: Pain Managment: ?Goal: General experience of comfort will improve ?Outcome: Not Applicable ?  ?Problem: Safety: ?Goal: Ability to remain free from injury will improve ?Outcome: Not Applicable ?  ?Problem: Skin Integrity: ?Goal: Risk for impaired skin integrity will decrease ?Outcome: Not Applicable ?  ?

## 2021-12-19 NOTE — Discharge Summary (Signed)
Physician Discharge Summary   Patient: Ralph Brown MRN: [id number] DOB: 11-20-1940  Admit date:     12/18/2021  Discharge date: {dischdate:26783}  Discharge Physician: Annita Brod   PCP: Idelle Crouch, MD   Recommendations at discharge:  {Tip this will not be part of the note when signed- Example include specific recommendations for outpatient follow-up, pending tests to follow-up on. (Optional):26781}  ***  Discharge Diagnoses: Principal Problem:   Acute pulmonary embolism (HCC) Active Problems:   TIA (transient ischemic attack)   Elevated blood pressure reading   HLD (hyperlipidemia)   Hypothyroidism  Resolved Problems:   * No resolved hospital problems. *  Hospital Course: 81 year old male with past medical history of hyperlipidemia, hypothyroidism, prostate cancer and TIA presented to the emergency room on 5/31 with complaints of right lower chest pain x1 day.  Work-up revealed right lower lobe PE by CT angiogram was slightly elevated RV/LV ratio patient not hypoxic.  BNP within normal limits.  Echocardiogram unremarkable.  Assessment and Plan: * Acute pulmonary embolism ([place]) CT scan showed left lower lobe PE, with slightly elevated RV/LV ratio indicating possible right heart strain injury.  However, BNP and echocardiogram unremarkable.  Patient doing okay with no complaints.  Have started Eliquis which he will need to be on for 6 months.  Unclear etiology.  Doubt genetic given this is his first incident at age 81.  He does have a distant history of prostate cancer and since no other etiology could be determined, would advise PCP to do screening for malignancy for completeness sake   Hypothyroidism - Synthroid  HLD (hyperlipidemia) - Patient is not taking Lipitor currently, and plans discussed with PCP about restarting  Elevated blood pressure reading Blood pressure 189/83, however rest of blood pressures have been stable.  This was likely secondary to pain.   He at one point was taking Norvasc for rainouts phenomenon no previous history of hypertension.  TIA (transient ischemic attack) Aspirin on hold for 6 months while on Eliquis      {Tip this will not be part of the note when signed Body mass index is 23.18 kg/m. , ,  (Optional):26781}  {(NOTE) Pain control PDMP Statment (Optional):26782} Consultants: *** Procedures performed: ***  Disposition: {Plan; Disposition:26390} Diet recommendation:  Discharge Diet Orders (From admission, onward)     Start     Ordered   12/19/21 0000  Diet - low sodium heart healthy        12/19/21 1828           {Diet_Plan:26776} DISCHARGE MEDICATION: Allergies as of 12/19/2021       Reactions   Sulfa Antibiotics Other (See Comments)   Unknown reaction- childhood allergy        Medication List     STOP taking these medications    aspirin 81 MG chewable tablet       TAKE these medications    acyclovir 800 MG tablet Commonly known as: ZOVIRAX Take 1 tablet by mouth daily.   AIRBORNE PO Take by mouth.   Apixaban Starter Pack ('10mg'$  and '5mg'$ ) Commonly known as: ELIQUIS STARTER PACK Take as directed on package: start with two-'5mg'$  tablets twice daily for 7 days. On day 8, switch to one-'5mg'$  tablet twice daily.   atorvastatin 40 MG tablet Commonly known as: Lipitor Take 1 tablet (40 mg total) by mouth daily.   brimonidine-timolol 0.2-0.5 % ophthalmic solution Commonly known as: COMBIGAN Place 1 drop into both eyes every 12 (twelve) hours.  Calcium Carbonate-Vitamin D 600-400 MG-UNIT tablet Take by mouth.   dorzolamide 2 % ophthalmic solution Commonly known as: TRUSOPT 1 drop 2 (two) times daily.   FISH OIL + D3 PO Take 1 capsule by mouth daily.   Glucosamine-Chondroitin 750-600 MG Tabs Take 1 tablet by mouth daily.   levothyroxine 100 MCG tablet Commonly known as: SYNTHROID Take 100 mcg by mouth daily before breakfast.   memantine 5 MG tablet Commonly known as:  NAMENDA Take 5 mg by mouth 2 (two) times daily.   mometasone 0.1 % cream Commonly known as: ELOCON Apply 1 application. topically as directed. 1 daily up to 5 days per week (M-F) to aa skin color loss   multivitamin capsule Take 1 capsule by mouth daily.   mupirocin ointment 2 % Commonly known as: BACTROBAN Apply 1 application. topically daily. Qd to excision site   NON FORMULARY Med Name: nifedipine 4% ointment on fingertips at bedtime in cold weather   Opzelura 1.5 % Crea Generic drug: Ruxolitinib Phosphate Apply 1 application. topically as directed. Qd-bid   oxycodone 5 MG capsule Commonly known as: OXY-IR Take 1 capsule (5 mg total) by mouth every 4 (four) hours as needed.   Rocklatan 0.02-0.005 % Soln Generic drug: Netarsudil-Latanoprost Place 1 drop into both eyes at bedtime.   terbinafine 250 MG tablet Commonly known as: LAMISIL Take 250 mg by mouth daily.        Follow-up Information     Idelle Crouch, MD Follow up in 2 week(s).   Specialty: Internal Medicine Contact information: [place] [phone]         [place] Follow up in 3 month(s).   Why: CT scan of chest for Left upper lobe nodule followup.  (Dr. Doy Hutching will set up) Contact information: [place] Follow up in 2 month(s).   Why: Ultrasound of left thyroid lobe nodule followup.  Dr. Doy Hutching will set up Contact information: [place]               Discharge Exam: Danley Danker Weights   12/17/21 2210  Weight: 71.2 kg   ***  Condition at discharge: {DC Condition:26389}  The results of significant diagnostics from this hospitalization (including imaging, microbiology, ancillary and laboratory) are listed below for reference.   Imaging Studies: DG Ribs Unilateral W/Chest Right  Result Date:  12/17/2021 CLINICAL DATA:  pain without injury EXAM: RIGHT RIBS AND CHEST - 3+ VIEW COMPARISON:  None Available. FINDINGS: No acute displaced fracture or other bone lesions are seen involving the right ribs. The heart and mediastinal contours are within normal limits. Left base linear atelectasis. No focal consolidation. No pulmonary edema. No pleural effusion. No pneumothorax. No acute osseous abnormality. IMPRESSION: 1. No acute displaced right rib fracture. Please note, nondisplaced rib fractures may be occult on radiograph. 2. No acute cardiopulmonary abnormality. Electronically Signed   By: Iven Finn M.D.   On: 12/17/2021 22:47   CT Angio Chest PE W/Cm &/Or Wo Cm  Result Date: 12/18/2021 CLINICAL DATA:  Right chest pain. EXAM: CT ANGIOGRAPHY CHEST WITH CONTRAST TECHNIQUE: Multidetector CT imaging of the chest was performed using the standard protocol during bolus administration of intravenous contrast. Multiplanar CT image reconstructions and MIPs were obtained to evaluate the vascular anatomy. RADIATION DOSE REDUCTION: This exam was performed according to the departmental dose-optimization program  which includes automated exposure control, adjustment of the mA and/or kV according to patient size and/or use of iterative reconstruction technique. CONTRAST:  66m OMNIPAQUE IOHEXOL 350 MG/ML SOLN COMPARISON:  Rib series with a PA chest yesterday. FINDINGS: Cardiovascular: The pulmonary arteries are normal in caliber. There is small to moderate clot burden in the right lower lobe, with filling defect in the distal right lower lobe main artery and partially occlusive thrombus extending from this into 3 basally directed segmental and multiple subsegmental downstream arterial branches. Evidence suggesting at least early right heart strain with slightly elevated RV/LV ratio of 1.1. There is no pericardial effusion. There is no visible coronary artery calcification. There is mild aortic atherosclerosis without  aneurysm or dissection. The great vessels branch normally. The pulmonary veins are decompressed. Mediastinum/Nodes: There are surgical clips alongside an asymmetrically smaller right lobe of the thyroid gland compatible with a partial right hemithyroidectomy. There is a heterogeneous 1.6 cm nodule in the posterior aspect of the left lobe. Axillary spaces are clear. There is no intrathoracic adenopathy. There is no tracheal mass. No esophageal thickening is seen. Small hiatal hernia. Lungs/Pleura: There is a minimal right and trace left pleural effusions. Interstitial and patchy ground-glass opacity in the right lower lobe basal segments is noted most likely relating to evolving infarctions. There is posterior atelectasis in the lingular base and left lower lobe linear scar-like opacities in the right middle lobe a small consolidation versus focal fibrosis in the right middle lobe anterior extreme base. Remainder of both lungs clear. There is a 4.5 mm noncalcified pulmonary nodule peripherally in the lingula on series 7 axial 75. There are no other appreciable nodules. Upper Abdomen: No acute abnormality. There is a 1.5 cm cyst of 9.2 Hounsfield units in the anterior segment of the right lobe of the liver and a few tiny too small to characterize hypodensities in the remaining liver. The pancreas moderately atrophic. Musculoskeletal: There are multilevel degenerative discs, osteopenia and spondylosis thoracic spine mild thoracic kypho dextroscoliosis. No chest wall abnormality or destructive bone lesion. Review of the MIP images confirms the above findings. IMPRESSION: 1. Small to moderate right lower lobe clot burden and a slightly elevated RV/LV ratio. 2. Cardiomegaly. 3. Aortic atherosclerosis. 4. Right lower lobe basal opacities most likely due to evolving infarcts, with small consolidation or fibrosis in the right middle lobe base. 5. 4.5 mm left upper lobe nodule. No routine follow-up imaging is recommended per  Fleischner Society Guidelines. These guidelines do not apply to immunocompromised patients and patients with cancer. Follow up in patients with significant comorbidities as clinically warranted. For lung cancer screening, adhere to Lung-RADS guidelines. Reference: Radiology. 2017; 284(1):228-43. 6. 1.5 cm hepatic cyst and additional too small to characterize hepatic hypodensities. 7. Small hiatal hernia. 8. Osteopenia and degenerative change. 9. 1.6 cm nodule in the left lobe of the thyroid gland. Follow-up ultrasound recommended. Evidence of prior surgery to the right lobe. 10. Results telephoned to Dr. JCharna Archerat 6:11 a.m., 12/18/2021. Electronically Signed   By: KTelford NabM.D.   On: 12/18/2021 06:23   [place]Venous Img Lower Bilateral (DVT)  Result Date: 12/18/2021 CLINICAL DATA:  Pulmonary embolism EXAM: BILATERAL LOWER EXTREMITY VENOUS DOPPLER ULTRASOUND TECHNIQUE: Gray-scale sonography with graded compression, as well as color Doppler and duplex ultrasound were performed to evaluate the lower extremity deep venous systems from the level of the common femoral vein and including the common femoral, femoral, profunda femoral, popliteal and calf veins including the posterior tibial, peroneal and  gastrocnemius veins when visible. The superficial great saphenous vein was also interrogated. Spectral Doppler was utilized to evaluate flow at rest and with distal augmentation maneuvers in the common femoral, femoral and popliteal veins. COMPARISON:  None Available. FINDINGS: RIGHT LOWER EXTREMITY Common Femoral Vein: No evidence of thrombus. Normal compressibility, respiratory phasicity and response to augmentation. Saphenofemoral Junction: No evidence of thrombus. Normal compressibility and flow on color Doppler imaging. Profunda Femoral Vein: No evidence of thrombus. Normal compressibility and flow on color Doppler imaging. Femoral Vein: No evidence of thrombus. Normal compressibility, respiratory phasicity and  response to augmentation. Popliteal Vein: No evidence of thrombus. Normal compressibility, respiratory phasicity and response to augmentation. Calf Veins: No evidence of thrombus, though note that the peroneal vein was suboptimally evaluated. Normal compressibility and flow on color Doppler imaging. Superficial Great Saphenous Vein: No evidence of thrombus. Normal compressibility. Venous Reflux:  None. Other Findings:  None. LEFT LOWER EXTREMITY Common Femoral Vein: No evidence of thrombus. Normal compressibility, respiratory phasicity and response to augmentation. Saphenofemoral Junction: No evidence of thrombus. Normal compressibility and flow on color Doppler imaging. Profunda Femoral Vein: No evidence of thrombus. Normal compressibility and flow on color Doppler imaging. Femoral Vein: No evidence of thrombus. Normal compressibility, respiratory phasicity and response to augmentation. Popliteal Vein: No evidence of thrombus. Normal compressibility, respiratory phasicity and response to augmentation. Calf Veins: No evidence of thrombus. Normal compressibility and flow on color Doppler imaging. Superficial Great Saphenous Vein: No evidence of thrombus. Normal compressibility. Venous Reflux:  None. Other Findings:  None. IMPRESSION: No evidence of deep venous thrombosis in either lower extremity. Electronically Signed   By: Valetta Mole M.D.   On: 12/18/2021 10:48   ECHOCARDIOGRAM COMPLETE  Result Date: 12/19/2021    ECHOCARDIOGRAM REPORT   Patient Name:   Ralph Brown Date of Exam: 12/19/2021 Medical Rec #:  [id number]     Height:       69.0 in Accession #:    [id number]    Weight:       157.0 lb Date of Birth:  Aug 13, 1940     BSA:          1.864 m Patient Age:    24 years      BP:           126/62 mmHg Patient Gender: M             HR:           57 bpm. Exam Location:  [facility] Procedure: 2D Echo, Color Doppler and Cardiac Doppler Indications:     I26.09 Pulmonary Embolus  History:         Patient has prior history  of Echocardiogram examinations.                  Mitral Valve Prolapse; Risk Factors:Dyslipidemia.  Sonographer:     Charmayne Sheer Referring Phys:  [id number] Soledad Gerlach NIU Diagnosing Phys: Isaias Cowman MD  Sonographer Comments: Suboptimal subcostal window. IMPRESSIONS  1. Left ventricular ejection fraction, by estimation, is 60 to 65%. The left ventricle has normal function. The left ventricle has no regional wall motion abnormalities. Left ventricular diastolic parameters were normal.  2. Right ventricular systolic function is normal. The right ventricular size is normal.  3. The mitral valve is normal in structure. Mild mitral valve regurgitation. No evidence of mitral stenosis.  4. The aortic valve is normal in structure. Aortic valve regurgitation is not visualized. No aortic stenosis is present.  5. The inferior vena  cava is normal in size with greater than 50% respiratory variability, suggesting right atrial pressure of 3 mmHg. FINDINGS  Left Ventricle: Left ventricular ejection fraction, by estimation, is 60 to 65%. The left ventricle has normal function. The left ventricle has no regional wall motion abnormalities. The left ventricular internal cavity size was normal in size. There is  no left ventricular hypertrophy. Left ventricular diastolic parameters were normal. Right Ventricle: The right ventricular size is normal. No increase in right ventricular wall thickness. Right ventricular systolic function is normal. Left Atrium: Left atrial size was normal in size. Right Atrium: Right atrial size was normal in size. Pericardium: There is no evidence of pericardial effusion. Mitral Valve: The mitral valve is normal in structure. Mild mitral valve regurgitation. No evidence of mitral valve stenosis. MV peak gradient, 3.3 mmHg. The mean mitral valve gradient is 1.0 mmHg. Tricuspid Valve: The tricuspid valve is normal in structure. Tricuspid valve regurgitation is mild . No evidence of tricuspid stenosis. Aortic  Valve: The aortic valve is normal in structure. Aortic valve regurgitation is not visualized. No aortic stenosis is present. Aortic valve mean gradient measures 3.0 mmHg. Aortic valve peak gradient measures 4.1 mmHg. Aortic valve area, by VTI measures 3.01 cm. Pulmonic Valve: The pulmonic valve was normal in structure. Pulmonic valve regurgitation is mild to moderate. No evidence of pulmonic stenosis. Aorta: The aortic root is normal in size and structure. Venous: The inferior vena cava is normal in size with greater than 50% respiratory variability, suggesting right atrial pressure of 3 mmHg. IAS/Shunts: No atrial level shunt detected by color flow Doppler.  LEFT VENTRICLE PLAX 2D LVIDd:         4.02 cm   Diastology LVIDs:         2.31 cm   LV e' medial:    5.98 cm/s LV PW:         1.27 cm   LV E/e' medial:  11.2 LV IVS:        0.72 cm   LV e' lateral:   7.62 cm/s LVOT diam:     2.10 cm   LV E/e' lateral: 8.8 LV SV:         67 LV SV Index:   36 LVOT Area:     3.46 cm  RIGHT VENTRICLE RV Basal diam:  2.75 cm RV Mid diam:    3.95 cm RV S prime:     12.20 cm/s LEFT ATRIUM             Index        RIGHT ATRIUM           Index LA diam:        3.30 cm 1.77 cm/m   RA Area:     14.40 cm LA Vol (A2C):   30.9 ml 16.58 ml/m  RA Volume:   30.90 ml  16.58 ml/m LA Vol (A4C):   34.0 ml 18.24 ml/m LA Biplane Vol: 33.5 ml 17.97 ml/m  AORTIC VALVE                    PULMONIC VALVE AV Area (Vmax):    3.31 cm     PV Vmax:          1.00 m/s AV Area (Vmean):   2.70 cm     PV Vmean:         65.900 cm/s AV Area (VTI):     3.01 cm     PV VTI:  0.212 m AV Vmax:           101.00 cm/s  PV Peak grad:     4.0 mmHg AV Vmean:          79.000 cm/s  PV Mean grad:     2.0 mmHg AV VTI:            0.221 m      PR End Diast Vel: 3.49 msec AV Peak Grad:      4.1 mmHg AV Mean Grad:      3.0 mmHg LVOT Vmax:         96.60 cm/s LVOT Vmean:        61.500 cm/s LVOT VTI:          0.192 m LVOT/AV VTI ratio: 0.87  AORTA Ao Root diam: 3.80 cm  MITRAL VALVE MV Area (PHT): 2.51 cm    SHUNTS MV Area VTI:   2.23 cm    Systemic VTI:  0.19 m MV Peak grad:  3.3 mmHg    Systemic Diam: 2.10 cm MV Mean grad:  1.0 mmHg MV Vmax:       0.90 m/s MV Vmean:      47.3 cm/s MV Decel Time: 302 msec MV E velocity: 66.80 cm/s MV A velocity: 70.70 cm/s MV E/A ratio:  0.94 Isaias Cowman MD Electronically signed by Isaias Cowman MD Signature Date/Time: 12/19/2021/1:26:28 PM    Final     Microbiology: No results found for this or any previous visit.  Labs: CBC: Recent Labs  Lab 12/18/21 0425 12/19/21 0504  WBC 9.7 9.0  HGB 17.0 15.5  HCT 51.4 46.1  MCV 93.5 92.9  PLT 213 409   Basic Metabolic Panel: Recent Labs  Lab 12/18/21 0425  NA 136  K 4.9  CL 107  CO2 22  GLUCOSE 112*  BUN 22  CREATININE 0.83  CALCIUM 9.4   Liver Function Tests: No results for input(s): AST, ALT, ALKPHOS, BILITOT, PROT, ALBUMIN in the last 168 hours. CBG: Recent Labs  Lab 12/19/21 0523 12/19/21 1125 12/19/21 1657  GLUCAP 113* 142* 99    Discharge time spent: {LESS THAN/GREATER THAN:26388} 30 minutes.  Signed: Annita Brod, MD [facility] 12/19/2021

## 2021-12-19 NOTE — Progress Notes (Signed)
SATURATION QUALIFICATIONS: (This note is used to comply with regulatory documentation for home oxygen)  Patient Saturations on Room Air at Rest = 96%  Patient Saturations on Room Air while Ambulating = 97%   Please briefly explain why patient needs home oxygen:  No SOB or CP reported while ambulating on RA

## 2021-12-20 ENCOUNTER — Other Ambulatory Visit: Payer: Self-pay | Admitting: Internal Medicine

## 2021-12-20 DIAGNOSIS — I2699 Other pulmonary embolism without acute cor pulmonale: Secondary | ICD-10-CM

## 2021-12-20 MED ORDER — OXYCODONE HCL 5 MG PO TABS: 5.0000 mg | ORAL_TABLET | ORAL | Status: DC | PRN

## 2021-12-20 MED ORDER — OXYCODONE HCL 5 MG PO TABS: 5.0000 mg | ORAL_TABLET | ORAL | 0 refills | Status: DC | PRN

## 2021-12-21 DIAGNOSIS — R911 Solitary pulmonary nodule: Secondary | ICD-10-CM | POA: Diagnosis present

## 2021-12-21 DIAGNOSIS — E041 Nontoxic single thyroid nodule: Secondary | ICD-10-CM | POA: Diagnosis present

## 2021-12-21 NOTE — Assessment & Plan Note (Signed)
Incidentally noted on CT scan of chest.  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  Nodule located on left lobe.  Again as above, given that we do not have a determinate cause for his PE and to rule out malignancy, patient would benefit from a thyroid ultrasound to see if this is cystic versus solid nodule.

## 2021-12-21 NOTE — Assessment & Plan Note (Signed)
Discussed with patient.  Incidentally noted on CT scan of chest.  Normally by guidelines, given the small size, less than 5 mm, this would not be followed up on.  Even with previous history of malignancy, active malignancy would be required for follow-up.  That said, discussed with radiologist.  Given nodule location and that we do not have a cause for his PE and there can be PE associated with malignancy, patient would benefit from repeat CT scan in 3 months.  If there is no change in size, no further monitoring needed.

## 2022-04-07 ENCOUNTER — Other Ambulatory Visit: Payer: Self-pay | Admitting: Internal Medicine

## 2022-04-07 DIAGNOSIS — R911 Solitary pulmonary nodule: Secondary | ICD-10-CM

## 2022-04-07 DIAGNOSIS — I10 Essential (primary) hypertension: Secondary | ICD-10-CM

## 2022-04-17 ENCOUNTER — Ambulatory Visit
Admission: RE | Admit: 2022-04-17 | Discharge: 2022-04-17 | Disposition: A | Discharge status: Home / Self Care | Payer: Medicare Other | Source: Ambulatory Visit | Attending: Internal Medicine | Admitting: Internal Medicine

## 2022-04-17 DIAGNOSIS — R911 Solitary pulmonary nodule: Secondary | ICD-10-CM

## 2022-04-17 DIAGNOSIS — I10 Essential (primary) hypertension: Secondary | ICD-10-CM

## 2022-05-26 ENCOUNTER — Other Ambulatory Visit: Payer: Self-pay | Admitting: Physician Assistant

## 2022-05-26 ENCOUNTER — Ambulatory Visit
Admission: RE | Admit: 2022-05-26 | Discharge: 2022-05-26 | Disposition: A | Discharge status: Home / Self Care | Payer: Medicare Other | Source: Ambulatory Visit | Attending: Physician Assistant | Admitting: Physician Assistant

## 2022-05-26 DIAGNOSIS — Z86711 Personal history of pulmonary embolism: Secondary | ICD-10-CM

## 2022-05-26 DIAGNOSIS — R0789 Other chest pain: Secondary | ICD-10-CM

## 2022-05-26 DIAGNOSIS — R0781 Pleurodynia: Secondary | ICD-10-CM | POA: Diagnosis present

## 2022-05-26 LAB — POCT I-STAT CREATININE: Creatinine, Ser: 1.2 mg/dL (ref 0.61–1.24)

## 2022-05-26 MED ORDER — IOHEXOL 350 MG/ML SOLN
75.0000 mL | Freq: Once | INTRAVENOUS | Status: AC | PRN
  Administered 2022-05-26: 75 mL via INTRAVENOUS

## 2022-12-11 ENCOUNTER — Ambulatory Visit: Payer: Medicare Other | Admitting: Dermatology

## 2022-12-31 ENCOUNTER — Ambulatory Visit: Payer: Medicare Other | Admitting: Dermatology

## 2023-01-28 ENCOUNTER — Other Ambulatory Visit: Payer: Self-pay

## 2023-01-28 DIAGNOSIS — M47816 Spondylosis without myelopathy or radiculopathy, lumbar region: Secondary | ICD-10-CM

## 2023-01-28 DIAGNOSIS — M5137 Other intervertebral disc degeneration, lumbosacral region: Secondary | ICD-10-CM

## 2023-02-04 IMAGING — US US EXTREM LOW VENOUS
1 series · 14 of 24 positions shown · non-contrast
Comparison: None Available.

CLINICAL DATA: Pulmonary embolism



[Series 1: us venous imag bi/left/(id) · portal-venous · 14 of 60 slices shown]
[im 1/60]
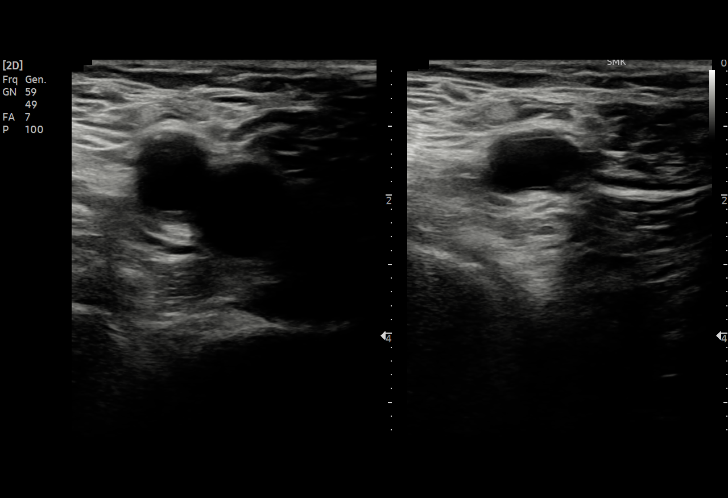
[im 6/60]
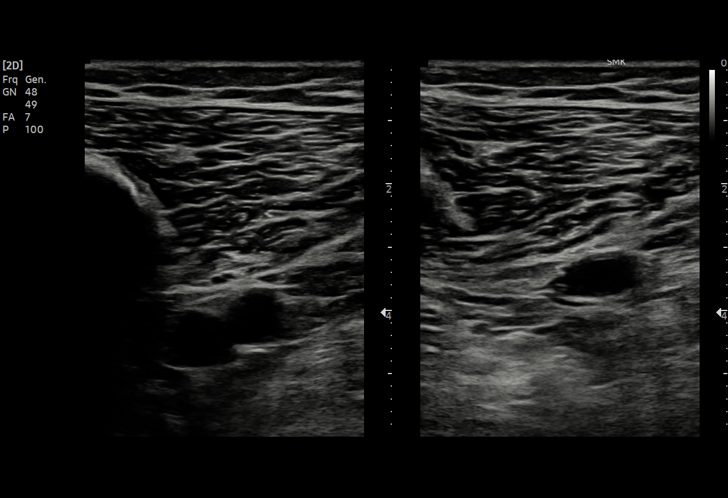
[im 11/60]
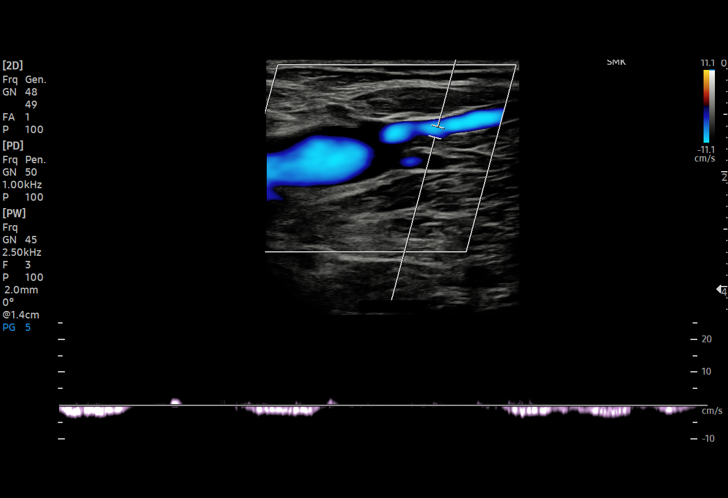
[im 16/60]
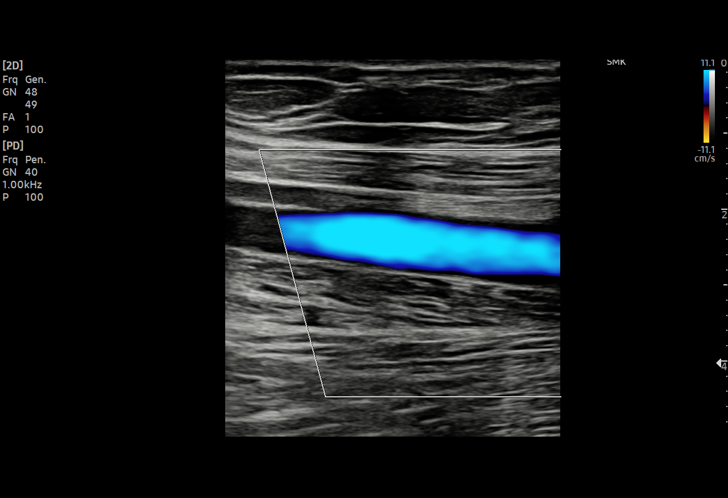
[im 18/60]
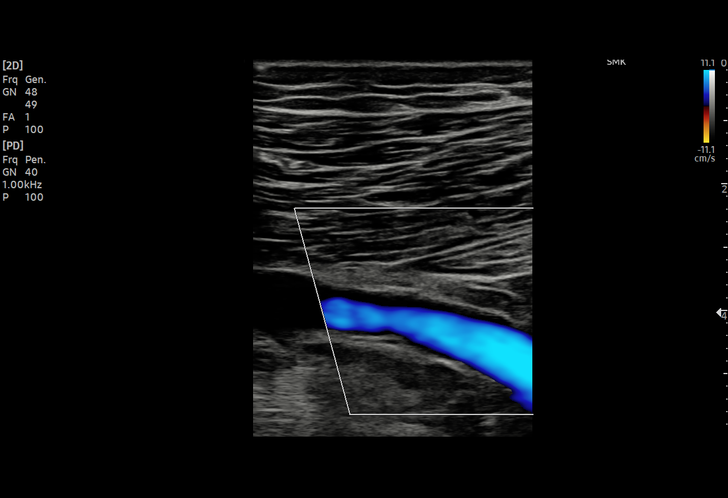
[im 24/60]
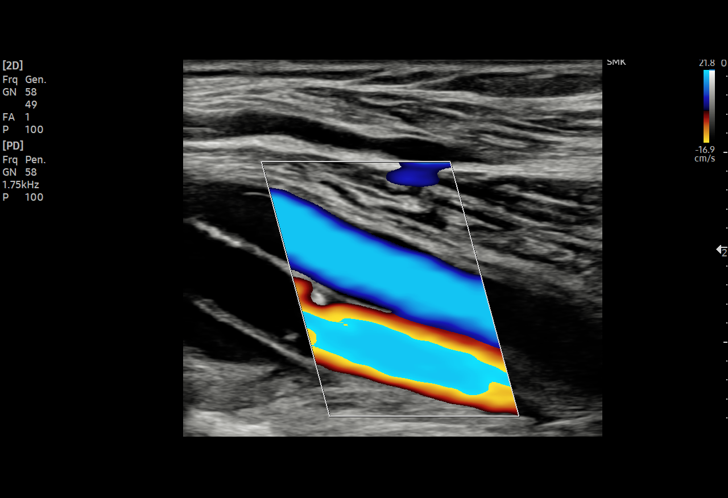
[im 29/60]
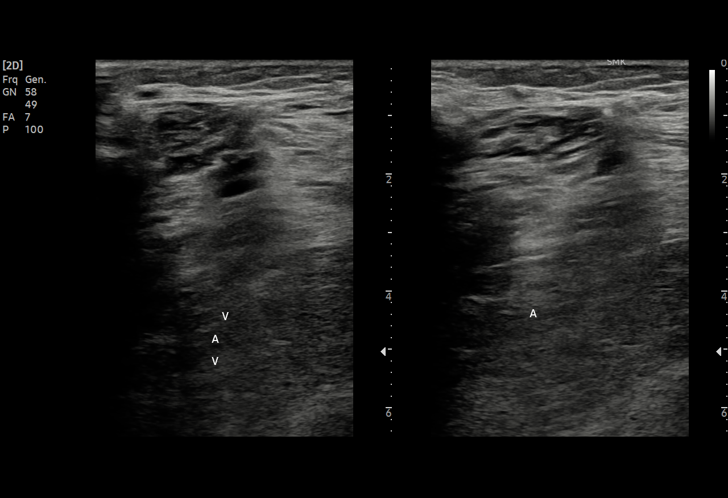
[im 31/60]
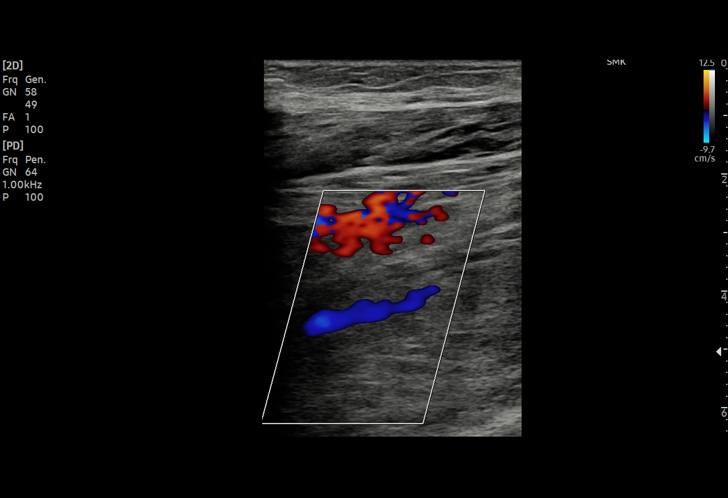
[im 36/60]
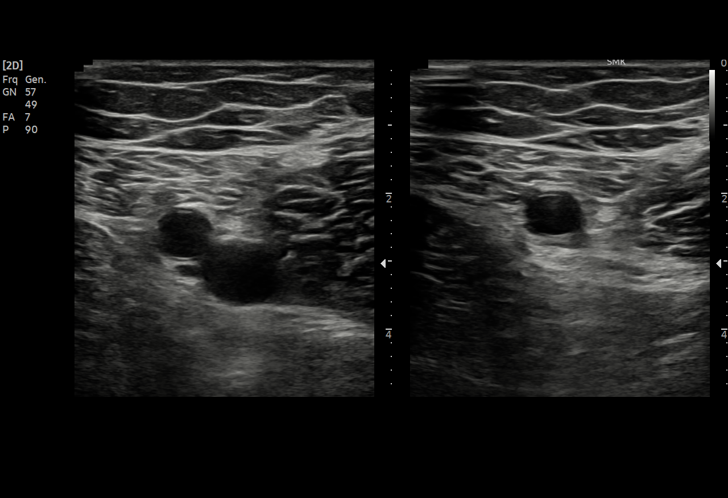
[im 42/60]
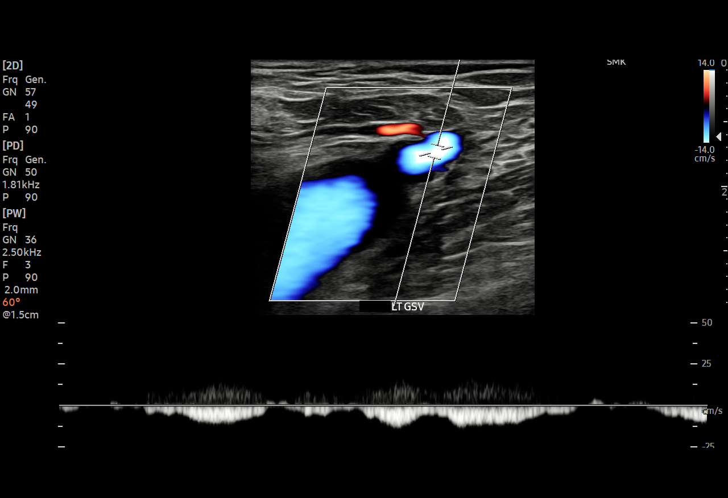
[im 47/60]
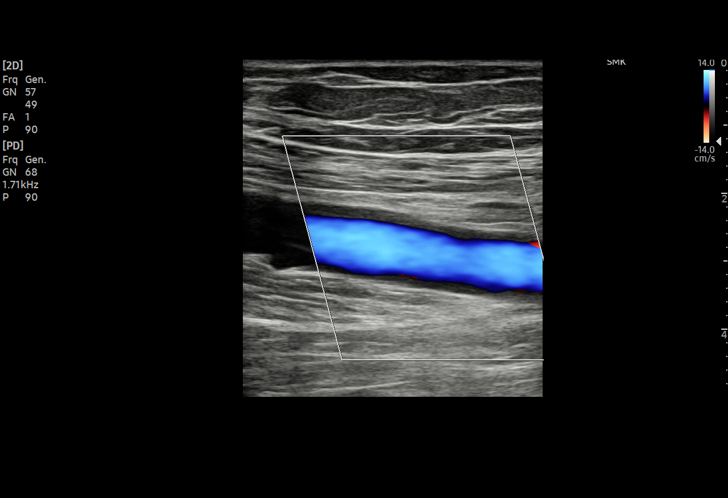
[im 49/60]
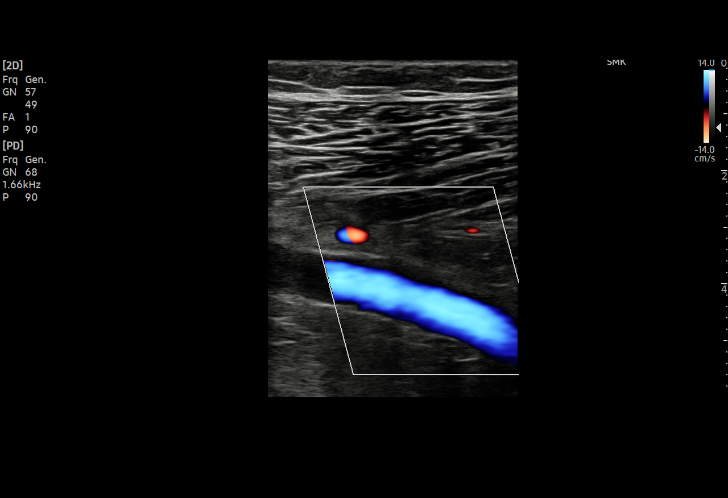
[im 54/60]
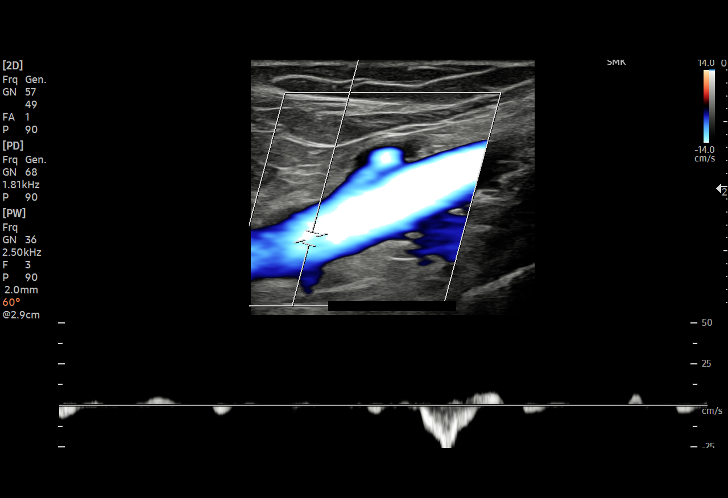
[im 60/60]
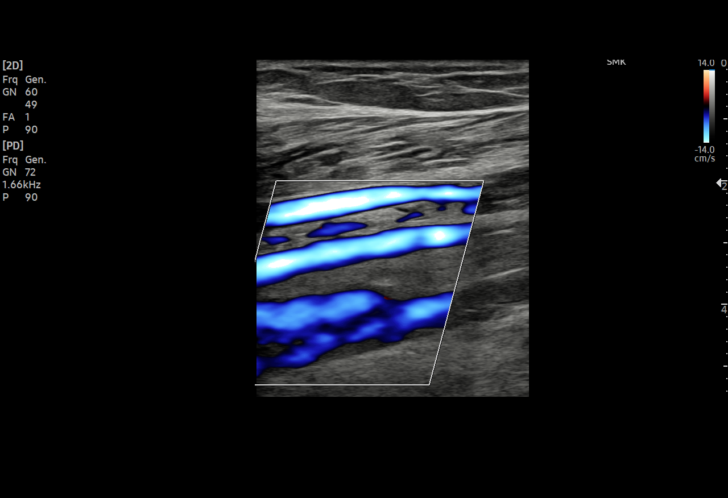

[14 of 24 positions shown; findings below may reference images not displayed]

FINDINGS: RIGHT LOWER EXTREMITY

Common Femoral Vein: No evidence of thrombus. Normal
compressibility, respiratory phasicity and response to augmentation.

Saphenofemoral Junction: No evidence of thrombus. Normal
compressibility and flow on color Doppler imaging.

Profunda Femoral Vein: No evidence of thrombus. Normal
compressibility and flow on color Doppler imaging.

Femoral Vein: No evidence of thrombus. Normal compressibility,
respiratory phasicity and response to augmentation.

Popliteal Vein: No evidence of thrombus. Normal compressibility,
respiratory phasicity and response to augmentation.

Calf Veins: No evidence of thrombus, though note that the peroneal
vein was suboptimally evaluated. Normal compressibility and flow on
color Doppler imaging.

Superficial Great Saphenous Vein: No evidence of thrombus. Normal
compressibility.

Venous Reflux:  None.

Other Findings:  None.

LEFT LOWER EXTREMITY

Common Femoral Vein: No evidence of thrombus. Normal
compressibility, respiratory phasicity and response to augmentation.

Saphenofemoral Junction: No evidence of thrombus. Normal
compressibility and flow on color Doppler imaging.

Profunda Femoral Vein: No evidence of thrombus. Normal
compressibility and flow on color Doppler imaging.

Femoral Vein: No evidence of thrombus. Normal compressibility,
respiratory phasicity and response to augmentation.

Popliteal Vein: No evidence of thrombus. Normal compressibility,
respiratory phasicity and response to augmentation.

Calf Veins: No evidence of thrombus. Normal compressibility and flow
on color Doppler imaging.

Superficial Great Saphenous Vein: No evidence of thrombus. Normal
compressibility.

Venous Reflux:  None.

Other Findings:  None.
IMPRESSION: No evidence of deep venous thrombosis in either lower extremity.

## 2023-02-09 ENCOUNTER — Ambulatory Visit
Admission: RE | Admit: 2023-02-09 | Discharge: 2023-02-09 | Disposition: A | Discharge status: Home / Self Care | Payer: Medicare Other | Source: Ambulatory Visit | Attending: Sports Medicine | Admitting: Sports Medicine

## 2023-02-09 DIAGNOSIS — M5137 Other intervertebral disc degeneration, lumbosacral region: Secondary | ICD-10-CM | POA: Insufficient documentation

## 2023-02-09 DIAGNOSIS — M47816 Spondylosis without myelopathy or radiculopathy, lumbar region: Secondary | ICD-10-CM | POA: Diagnosis not present

## 2023-03-04 ENCOUNTER — Ambulatory Visit: Payer: Medicare Other | Admitting: Dermatology

## 2023-03-04 VITALS — BP 103/61 | HR 52

## 2023-03-04 DIAGNOSIS — W908XXA Exposure to other nonionizing radiation, initial encounter: Secondary | ICD-10-CM | POA: Diagnosis not present

## 2023-03-04 DIAGNOSIS — L538 Other specified erythematous conditions: Secondary | ICD-10-CM

## 2023-03-04 DIAGNOSIS — L814 Other melanin hyperpigmentation: Secondary | ICD-10-CM

## 2023-03-04 DIAGNOSIS — L570 Actinic keratosis: Secondary | ICD-10-CM | POA: Diagnosis not present

## 2023-03-04 DIAGNOSIS — Z872 Personal history of diseases of the skin and subcutaneous tissue: Secondary | ICD-10-CM

## 2023-03-04 DIAGNOSIS — Z7189 Other specified counseling: Secondary | ICD-10-CM

## 2023-03-04 DIAGNOSIS — Z5111 Encounter for antineoplastic chemotherapy: Secondary | ICD-10-CM

## 2023-03-04 DIAGNOSIS — L578 Other skin changes due to chronic exposure to nonionizing radiation: Secondary | ICD-10-CM | POA: Diagnosis not present

## 2023-03-04 DIAGNOSIS — L603 Nail dystrophy: Secondary | ICD-10-CM

## 2023-03-04 DIAGNOSIS — Z1283 Encounter for screening for malignant neoplasm of skin: Secondary | ICD-10-CM

## 2023-03-04 DIAGNOSIS — D1801 Hemangioma of skin and subcutaneous tissue: Secondary | ICD-10-CM

## 2023-03-04 DIAGNOSIS — Z79899 Other long term (current) drug therapy: Secondary | ICD-10-CM

## 2023-03-04 DIAGNOSIS — D229 Melanocytic nevi, unspecified: Secondary | ICD-10-CM

## 2023-03-04 DIAGNOSIS — L821 Other seborrheic keratosis: Secondary | ICD-10-CM

## 2023-03-04 NOTE — Patient Instructions (Addendum)
In 6 weeks (~September 25), start 5-fluorouracil/calcipotriene cream twice a day for 10 days to affected areas including scalp. Prescription sent to [facility]. Patient advised they will receive an email to purchase the medication online and have it sent to their home. Patient provided with handout reviewing treatment course and side effects and advised to call or message [place] on MyChart with any concerns.  Reviewed course of treatment and expected reaction.  Patient advised to expect inflammation and crusting and advised that erosions are possible.  Patient advised to be diligent with sun protection during and after treatment. Counseled to keep medication out of reach of children and pets.  Instructions for [facility]  One or more of your medications was sent to the [facility] mail order [facility] pharmacy. You will receive an email from them and can purchase the medicine through that link. It will then be mailed to your home at the address you confirmed. If for any reason you do not receive an email from them, please check your spam folder. If you still do not find the email, please let us know. [facility] phone number is [phone].   5-Fluorouracil/Calcipotriene Patient Education   Actinic keratoses are the dry, red scaly spots on the skin caused by sun damage. A portion of these spots can turn into skin cancer with time, and treating them can help prevent development of skin cancer.   Treatment of these spots requires removal of the defective skin cells. There are various ways to remove actinic keratoses, including freezing with liquid nitrogen, treatment with creams, or treatment with a blue light procedure in the office.   5-fluorouracil cream is a topical cream used to treat actinic keratoses. It works by interfering with the growth of abnormal fast-growing skin cells, such as actinic keratoses. These cells peel off and are replaced by  healthy ones.   5-fluorouracil/calcipotriene is a combination of the 5-fluorouracil cream with a vitamin D analog cream called calcipotriene. The calcipotriene alone does not treat actinic keratoses. However, when it is combined with 5-fluorouracil, it helps the 5-fluorouracil treat the actinic keratoses much faster so that the same results can be achieved with a much shorter treatment time.  INSTRUCTIONS FOR 5-FLUOROURACIL/CALCIPOTRIENE CREAM:   5-fluorouracil/calcipotriene cream typically only needs to be used for 4-7 days. A thin layer should be applied twice a day to the treatment areas recommended by your physician.   If your physician prescribed you separate tubes of 5-fluourouracil and calcipotriene, apply a thin layer of 5-fluorouracil followed by a thin layer of calcipotriene.   Avoid contact with your eyes, nostrils, and mouth. Do not use 5-fluorouracil/calcipotriene cream on infected or open wounds.   You will develop redness, irritation and some crusting at areas where you have pre-cancer damage/actinic keratoses. IF YOU DEVELOP PAIN, BLEEDING, OR SIGNIFICANT CRUSTING, STOP THE TREATMENT EARLY - you have already gotten a good response and the actinic keratoses should clear up well.  Wash your hands after applying 5-fluorouracil 5% cream on your skin.   A moisturizer or sunscreen with a minimum SPF 30 should be applied each morning.   Once you have finished the treatment, you can apply a thin layer of Vaseline twice a day to irritated areas to soothe and calm the areas more quickly. If you experience significant discomfort, contact your physician.  For some patients it is necessary to repeat the treatment for best results.  SIDE EFFECTS: When using 5-fluorouracil/calcipotriene cream, you may have mild irritation, such as  redness, dryness, swelling, or a mild burning sensation. This usually resolves within 2 weeks. The more actinic keratoses you have, the more redness and inflammation  you can expect during treatment. Eye irritation has been reported rarely. If this occurs, please let us know.  If you have any trouble using this cream, please call the office. If you have any other questions about this information, please do not hesitate to ask me before you leave the office.  Due to recent changes in healthcare laws, you may see results of your pathology and/or laboratory studies on MyChart before the doctors have had a chance to review them. We understand that in some cases there may be results that are confusing or concerning to you. Please understand that not all results are received at the same time and often the doctors may need to interpret multiple results in order to provide you with the best plan of care or course of treatment. Therefore, we ask that you please give [place] 2 business days to thoroughly review all your results before contacting the office for clarification. Should we see a critical lab result, you will be contacted sooner.   If You Need Anything After Your Visit  If you have any questions or concerns for your doctor, please call our main line at [phone] and press option 4 to reach your doctor's medical assistant. If no one answers, please leave a voicemail as directed and we will return your call as soon as possible. Messages left after 4 pm will be answered the following business day.   You may also send [place] a message via MyChart. We typically respond to MyChart messages within 1-2 business days.  For prescription refills, please ask your pharmacy to contact our office. Our fax number is [phone].  If you have an urgent issue when the clinic is closed that cannot wait until the next business day, you can page your doctor at the number below.    Please note that while we do our best to be available for urgent issues outside of office hours, we are not available 24/7.   If you have an urgent issue and are unable to reach [place], you may choose to seek  medical care at your doctor's office, retail clinic, urgent care center, or emergency room.  If you have a medical emergency, please immediately call 911 or go to the emergency department.  Pager Numbers  - Dr. Gwen Pounds: [phone]  - Dr. Roseanne Reno: [phone]  - Dr. Katrinka Blazing: [phone]   In the event of inclement weather, please call our main line at [phone] for an update on the status of any delays or closures.  Dermatology Medication Tips: Please keep the boxes that topical medications come in in order to help keep track of the instructions about where and how to use these. Pharmacies typically print the medication instructions only on the boxes and not directly on the medication tubes.   If your medication is too expensive, please contact our office at [phone] option 4 or send [place] a message through MyChart.   We are unable to tell what your co-pay for medications will be in advance as this is different depending on your insurance coverage. However, we may be able to find a substitute medication at lower cost or fill out paperwork to get insurance to cover a needed medication.   If a prior authorization is required to get your medication covered by your insurance company, please allow [place] 1-2 business days to complete this process.  Drug prices often vary depending on where the prescription is filled and some pharmacies may offer cheaper prices.  The website www.goodrx.com contains coupons for medications through different pharmacies. The prices here do not account for what the cost may be with help from insurance (it may be cheaper with your insurance), but the website can give you the price if you did not use any insurance.  - You can print the associated coupon and take it with your prescription to the pharmacy.  - You may also stop by our office during regular business hours and pick up a GoodRx coupon card.  - If you need your prescription sent electronically to a  different pharmacy, notify our office through [facility] or by phone at [phone] option 4.     Si Usted Necesita Algo Despus de Su Visita  Tambin puede enviarnos un mensaje a travs de Clinical cytogeneticist. Por lo general respondemos a los mensajes de MyChart en el transcurso de 1 a 2 das hbiles.  Para renovar recetas, por favor pida a su farmacia que se ponga en contacto con nuestra oficina. Annie Sable de fax es [place] [phone].  Si tiene un asunto urgente cuando la clnica est cerrada y que no puede esperar hasta el siguiente da hbil, puede llamar/localizar a su doctor(a) al nmero que aparece a continuacin.   Por favor, tenga en cuenta que aunque hacemos todo lo posible para estar disponibles para asuntos urgentes fuera del horario de [place], no estamos disponibles las 24 horas del da, los 7 [address]  [address] de la [place].   Si tiene un problema urgente y no puede comunicarse con nosotros, puede optar por buscar atencin mdica  en el consultorio de su doctor(a), en una clnica privada, en un centro de atencin urgente o en una sala de emergencias.  Si tiene Engineer, drilling, por favor llame inmediatamente al 911 o vaya a la sala de emergencias.  Nmeros de bper  - Dr. Gwen Pounds: [phone]  - Dra. Roseanne Reno: [phone]  - Dr. Katrinka Blazing: [phone]   En caso de inclemencias del tiempo, por favor llame a Lacy Duverney principal al [phone] para una actualizacin sobre el [place] de cualquier retraso o cierre.  Consejos para la medicacin en dermatologa: Por favor, guarde las cajas en las que vienen los medicamentos de uso tpico para ayudarle a seguir las instrucciones sobre dnde y cmo usarlos. Las farmacias generalmente imprimen las instrucciones del medicamento slo en las cajas y no directamente en los tubos del [place].   Si su medicamento es muy caro, por favor, pngase en contacto con Rolm Gala llamando al [phone] y presione la opcin 4 o envenos un  mensaje a travs de Clinical cytogeneticist.   No podemos decirle cul ser su copago por los medicamentos por adelantado ya que esto es diferente dependiendo de la cobertura de su seguro. Sin embargo, es posible que podamos encontrar un medicamento sustituto a Audiological scientist un formulario para que el seguro cubra el medicamento que se considera necesario.   Si se requiere una autorizacin previa para que su compaa de seguros [place] su medicamento, por favor permtanos de 1 a 2 das hbiles para completar [address]  Los precios de los medicamentos varan con frecuencia dependiendo del Environmental consultant de dnde se surte la receta y alguna farmacias pueden ofrecer precios ms baratos.  El sitio web www.goodrx.com tiene cupones para medicamentos de Health and safety inspector. Los precios aqu no tienen en cuenta lo que podra costar con la ayuda del seguro (puede ser  ms barato con su seguro), pero el sitio web puede darle el precio si no Visual merchandiser.  - Puede imprimir el cupn correspondiente y llevarlo con su receta a la farmacia.  - Tambin puede pasar por nuestra oficina durante el horario de atencin regular y Education officer, museum una tarjeta de cupones de GoodRx.  - Si necesita que su receta se enve electrnicamente a una farmacia diferente, informe a nuestra oficina a travs de MyChart de  o por telfono llamando al [phone] y presione la opcin 4.

## 2023-03-04 NOTE — Progress Notes (Signed)
Follow-Up Visit   Subjective  Ralph Brown is a 82 y.o. male who presents for the following: Skin Cancer Screening and Full Body Skin Exam The patient presents for Total-Body Skin Exam (TBSE) for skin cancer screening and mole check. The patient has spots, moles and lesions to be evaluated, some may be new or changing. He has a few rough spots on his scalp to check. He also has thinning and peeling of his fingernails for a few months, some improvement. He had Covid about a month ago, but nails were peeling before. He has a history of AKs. No history of skin cancer.   The following portions of the chart were reviewed this encounter and updated as appropriate: medications, allergies, medical history  Review of Systems:  No other skin or systemic complaints except as noted in HPI or Assessment and Plan.  Objective  Well appearing patient in no apparent distress; mood and affect are within normal limits.  A full examination was performed including scalp, head, eyes, ears, nose, lips, neck, chest, axillae, abdomen, back, buttocks, bilateral upper extremities, bilateral lower extremities, hands, feet, fingers, toes, fingernails, and toenails. All findings within normal limits unless otherwise noted below.   Relevant physical exam findings are noted in the Assessment and Plan.  scalp x 10, face and nose x 6 (16) Erythematous thin papules/macules with gritty scale.   fingernails Peeling of the distal fingernails.   Assessment & Plan   SKIN CANCER SCREENING PERFORMED TODAY.  ACTINIC DAMAGE WITH PRECANCEROUS ACTINIC KERATOSES Counseling for Topical Chemotherapy Management: Patient exhibits: - Severe, confluent actinic changes with pre-cancerous actinic keratoses that is secondary to cumulative UV radiation exposure over time - Condition that is severe; chronic, not at goal. - diffuse scaly erythematous macules and papules with underlying dyspigmentation - Discussed Prescription "Field  Treatment" topical Chemotherapy for Severe, Chronic Confluent Actinic Changes with Pre-Cancerous Actinic Keratoses Field treatment involves treatment of an entire area of skin that has confluent Actinic Changes (Sun/ Ultraviolet light damage) and PreCancerous Actinic Keratoses by method of PhotoDynamic Therapy (PDT) and/or prescription Topical Chemotherapy agents such as 5-fluorouracil, 5-fluorouracil/calcipotriene, and/or imiquimod.  The purpose is to decrease the number of clinically evident and subclinical PreCancerous lesions to prevent progression to development of skin cancer by chemically destroying early precancer changes that may or may not be visible.  It has been shown to reduce the risk of developing skin cancer in the treated area. As a result of treatment, redness, scaling, crusting, and open sores may occur during treatment course. One or more than one of these methods may be used and may have to be used several times to control, suppress and eliminate the PreCancerous changes. Discussed treatment course, expected reaction, and possible side effects. - Recommend daily broad spectrum sunscreen SPF 30+ to sun-exposed areas, reapply every 2 hours as needed.  - Staying in the shade or wearing long sleeves, sun glasses (UVA+UVB protection) and wide brim hats (4-inch brim around the entire circumference of the hat) are also recommended. - Call for new or changing lesions. - Start 5-fluorouracil/calcipotriene cream twice a day for 10 days to affected areas including scalp, starting in 6 weeks. Prescription sent to [facility]. Patient advised they will receive an email to purchase the medication online and have it sent to their home. Patient provided with handout reviewing treatment course and side effects and advised to call or message [place] on MyChart with any concerns.  Reviewed course of treatment and expected reaction.  Patient advised to expect inflammation and crusting and  advised that erosions are possible.  Patient advised to be diligent with sun protection during and after treatment. Counseled to keep medication out of reach of children and pets.   LENTIGINES, SEBORRHEIC KERATOSES, HEMANGIOMAS - Benign normal skin lesions - Benign-appearing - Call for any changes  MELANOCYTIC NEVI - Tan-brown and/or pink-flesh-colored symmetric macules and papules - Benign appearing on exam today - Observation - Call clinic for new or changing moles - Recommend daily use of broad spectrum spf 30+ sunscreen to sun-exposed areas.   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 Chronic and persistent condition with duration or expected duration over one year. Condition is bothersome/symptomatic for patient. Currently flared. Rosacea is a chronic progressive skin condition usually affecting the face of adults, causing redness and/or acne bumps. It is treatable but not curable. It sometimes affects the eyes (ocular rosacea) as well. It may respond to topical and/or systemic medication and can flare with stress, sun exposure, alcohol, exercise, topical steroids (including hydrocortisone/cortisone 10) and some foods.  Daily application of broad spectrum spf 30+ sunscreen to face is recommended to reduce flares. Treatment Plan Recommend patient discuss with eye dr. He is on eye drops for glaucoma.   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 They are chronic with expected duration over 1 year. A portion of actinic keratoses will progress to squamous cell carcinoma of the skin. It is not possible to reliably predict which spots will progress to skin cancer and so treatment is recommended to prevent development of skin cancer.  Recommend daily broad spectrum sunscreen SPF 30+ to sun-exposed areas, reapply every 2  hours as needed.  Recommend staying in the shade or wearing long sleeves, sun glasses (UVA+UVB protection) and wide brim hats (4-inch brim around the entire circumference of the hat). Call for new or changing lesions.  Destruction of lesion - scalp x 10, face and nose x 6 (16)  Destruction method: cryotherapy   Informed consent: discussed and consent obtained   Lesion destroyed using liquid nitrogen: Yes   Region frozen until ice ball extended beyond lesion: Yes   Outcome: patient tolerated procedure well with no complications   Post-procedure details: wound care instructions given   Additional details:  Prior to procedure, discussed risks of blister formation, small wound, skin dyspigmentation, or rare scar following cryotherapy. Recommend Vaseline ointment to treated areas while healing.   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 Benign-appearing.  Observation.  Call clinic for new or changing lesions.  Recommend daily use of broad spectrum spf 30+ sunscreen to sun-exposed areas.  Observation.    Return in about 1 year (around 03/03/2024) for TBSE, Hx AKs.  Wendee Beavers, CMA, am acting as scribe for Armida Sans, MD .   Documentation: I have reviewed the above documentation for accuracy and completeness, and I agree with the above.  Armida Sans, MD

## 2023-03-13 ENCOUNTER — Encounter: Payer: Self-pay | Admitting: Dermatology

## 2023-03-14 ENCOUNTER — Encounter: Payer: Self-pay | Admitting: Dermatology

## 2024-01-27 ENCOUNTER — Encounter: Payer: Self-pay | Admitting: Dermatology

## 2024-01-27 ENCOUNTER — Ambulatory Visit: Payer: Medicare Other | Admitting: Dermatology

## 2024-01-27 DIAGNOSIS — L570 Actinic keratosis: Secondary | ICD-10-CM

## 2024-01-27 DIAGNOSIS — D229 Melanocytic nevi, unspecified: Secondary | ICD-10-CM

## 2024-01-27 DIAGNOSIS — W908XXA Exposure to other nonionizing radiation, initial encounter: Secondary | ICD-10-CM | POA: Diagnosis not present

## 2024-01-27 DIAGNOSIS — Z7189 Other specified counseling: Secondary | ICD-10-CM

## 2024-01-27 DIAGNOSIS — L578 Other skin changes due to chronic exposure to nonionizing radiation: Secondary | ICD-10-CM

## 2024-01-27 DIAGNOSIS — B351 Tinea unguium: Secondary | ICD-10-CM

## 2024-01-27 DIAGNOSIS — Z1283 Encounter for screening for malignant neoplasm of skin: Secondary | ICD-10-CM

## 2024-01-27 DIAGNOSIS — L818 Other specified disorders of pigmentation: Secondary | ICD-10-CM

## 2024-01-27 DIAGNOSIS — L820 Inflamed seborrheic keratosis: Secondary | ICD-10-CM | POA: Diagnosis not present

## 2024-01-27 DIAGNOSIS — L814 Other melanin hyperpigmentation: Secondary | ICD-10-CM | POA: Diagnosis not present

## 2024-01-27 DIAGNOSIS — L821 Other seborrheic keratosis: Secondary | ICD-10-CM

## 2024-01-27 DIAGNOSIS — D1801 Hemangioma of skin and subcutaneous tissue: Secondary | ICD-10-CM

## 2024-01-27 NOTE — Progress Notes (Signed)
 Follow-Up Visit   Subjective  Ralph Brown is a 83 y.o. male who presents for the following: Skin Cancer Screening and Full Body Skin Exam, hx of precancers, treated scalp with 5FU/Calcipotriene cream last fall with a good response.  The patient presents for Total-Body Skin Exam (TBSE) for skin cancer screening and mole check. The patient has spots, moles and lesions to be evaluated, some may be new or changing and the patient may have concern these could be cancer.   The following portions of the chart were reviewed this encounter and updated as appropriate: medications, allergies, medical history  Review of Systems:  No other skin or systemic complaints except as noted in HPI or Assessment and Plan.  Objective  Well appearing patient in no apparent distress; mood and affect are within normal limits.  A full examination was performed including scalp, head, eyes, ears, nose, lips, neck, chest, axillae, abdomen, back, buttocks, bilateral upper extremities, bilateral lower extremities, hands, feet, fingers, toes, fingernails, and toenails. All findings within normal limits unless otherwise noted below.   Relevant physical exam findings are noted in the Assessment and Plan.  scalp x 5 (5) Erythematous thin papules/macules with gritty scale.  left temple x 2, left lateral neck x 1 (2) Stuck-on, waxy, tan-brown papules and plaques -- Discussed benign etiology and prognosis.   Assessment & Plan   SKIN CANCER SCREENING PERFORMED TODAY.  LENTIGINES, SEBORRHEIC KERATOSES, HEMANGIOMAS - Benign normal skin lesions - Benign-appearing - Call for any changes  MELANOCYTIC NEVI - Tan-brown and/or pink-flesh-colored symmetric macules and papules - Benign appearing on exam today - Observation - Call clinic for new or changing moles - Recommend daily use of broad spectrum spf 30+ sunscreen to sun-exposed areas.    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  - Recommend staying in the shade or wearing long sleeves, sun glasses (UVA+UVB protection) and wide brim hats (4-inch brim around the entire circumference of the hat). - Call for new or changing lesions.  Destruction of lesion - scalp x 5 (5) Complexity: simple   Destruction method: cryotherapy   Informed consent: discussed and consent obtained   Timeout:  patient name, date of birth, surgical site, and procedure verified Lesion destroyed using liquid nitrogen: Yes   Region frozen until ice ball extended beyond lesion: Yes   Outcome: patient tolerated procedure well with no complications   Post-procedure details: wound care instructions given    INFLAMED SEBORRHEIC KERATOSIS (2) left temple x 2, left lateral neck x 1 (2) Symptomatic, irritating, patient would like treated.  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 Condition is improving with treatment but not currently at goal. Toenails  Continue Ciclopirox solution as prescribed by his Podiatrist    Return in about 1 year (around 01/26/2025) for TBSE, hx of Aks .  IFay Kirks, CMA, am  acting as scribe for Alm Rhyme, MD .   Documentation: I have reviewed the above documentation for accuracy and completeness, and I agree with the above.  Alm Rhyme, MD

## 2024-01-27 NOTE — Patient Instructions (Signed)

## 2024-07-26 ENCOUNTER — Ambulatory Visit: Admitting: Dermatology

## 2024-07-26 ENCOUNTER — Encounter: Payer: Self-pay | Admitting: Dermatology

## 2024-07-26 DIAGNOSIS — L820 Inflamed seborrheic keratosis: Secondary | ICD-10-CM | POA: Diagnosis not present

## 2024-07-26 DIAGNOSIS — L821 Other seborrheic keratosis: Secondary | ICD-10-CM

## 2024-07-26 DIAGNOSIS — Z1283 Encounter for screening for malignant neoplasm of skin: Secondary | ICD-10-CM

## 2024-07-26 DIAGNOSIS — W908XXA Exposure to other nonionizing radiation, initial encounter: Secondary | ICD-10-CM

## 2024-07-26 DIAGNOSIS — Z7189 Other specified counseling: Secondary | ICD-10-CM

## 2024-07-26 DIAGNOSIS — L570 Actinic keratosis: Secondary | ICD-10-CM | POA: Diagnosis not present

## 2024-07-26 DIAGNOSIS — L578 Other skin changes due to chronic exposure to nonionizing radiation: Secondary | ICD-10-CM | POA: Diagnosis not present

## 2024-07-26 DIAGNOSIS — L814 Other melanin hyperpigmentation: Secondary | ICD-10-CM

## 2024-07-26 DIAGNOSIS — D229 Melanocytic nevi, unspecified: Secondary | ICD-10-CM

## 2024-07-26 DIAGNOSIS — D1801 Hemangioma of skin and subcutaneous tissue: Secondary | ICD-10-CM | POA: Diagnosis not present

## 2024-07-26 NOTE — Patient Instructions (Addendum)
 Seborrheic Keratosis  What causes seborrheic keratoses? Seborrheic keratoses are harmless, common skin growths that first appear during adult life.  As time goes by, more growths appear.  Some people may develop a large number of them.  Seborrheic keratoses appear on both covered and uncovered body parts.  They are not caused by sunlight.  The tendency to develop seborrheic keratoses can be inherited.  They vary in color from skin-colored to gray, brown, or even black.  They can be either smooth or have a rough, warty surface.   Seborrheic keratoses are superficial and look as if they were stuck on the skin.  Under the microscope this type of keratosis looks like layers upon layers of skin.  That is why at times the top layer may seem to fall off, but the rest of the growth remains and re-grows.    Treatment Seborrheic keratoses do not need to be treated, but can easily be removed in the office.  Seborrheic keratoses often cause symptoms when they rub on clothing or jewelry.  Lesions can be in the way of shaving.  If they become inflamed, they can cause itching, soreness, or burning.  Removal of a seborrheic keratosis can be accomplished by freezing, burning, or surgery. If any spot bleeds, scabs, or grows rapidly, please return to have it checked, as these can be an indication of a skin cancer.  Cryotherapy Aftercare  Wash gently with soap and water everyday.   Apply Vaseline and Band-Aid daily until healed.   Actinic keratoses are precancerous spots that appear secondary to cumulative UV radiation exposure/sun exposure over time. They are chronic with expected duration over 1 year. A portion of actinic keratoses will progress to squamous cell carcinoma of the skin. It is not possible to reliably predict which spots will progress to skin cancer and so treatment is recommended to prevent development of skin cancer.  Recommend daily broad spectrum sunscreen SPF 30+ to sun-exposed areas, reapply  every 2 hours as needed.  Recommend staying in the shade or wearing long sleeves, sun glasses (UVA+UVB protection) and wide brim hats (4-inch brim around the entire circumference of the hat). Call for new or changing lesions.    Due to recent changes in healthcare laws, you may see results of your pathology and/or laboratory studies on MyChart before the doctors have had a chance to review them. We understand that in some cases there may be results that are confusing or concerning to you. Please understand that not all results are received at the same time and often the doctors may need to interpret multiple results in order to provide you with the best plan of care or course of treatment. Therefore, we ask that you please give us  2 business days to thoroughly review all your results before contacting the office for clarification. Should we see a critical lab result, you will be contacted sooner.   If You Need Anything After Your Visit  If you have any questions or concerns for your doctor, please call our main line at [phone] and press option 4 to reach your doctor's medical assistant. If no one answers, please leave a voicemail as directed and we will return your call as soon as possible. Messages left after 4 pm will be answered the following business day.   You may also send us  a message via MyChart. We typically respond to MyChart messages within 1-2 business days.  For prescription refills, please ask your pharmacy to contact our office. Our fax  number is [phone].  If you have an urgent issue when the clinic is closed that cannot wait until the next business day, you can page your doctor at the number below.    Please note that while we do our best to be available for urgent issues outside of office hours, we are not available 24/7.   If you have an urgent issue and are unable to reach us , you may choose to seek medical care at your doctor's office, retail clinic, urgent care  center, or emergency room.  If you have a medical emergency, please immediately call 911 or go to the emergency department.  Pager Numbers  - Dr. Hester: [phone]  - Dr. Jackquline: [phone]  - Dr. Claudene: [phone]   - Dr. Raymund: [phone]  In the event of inclement weather, please call our main line at [phone] for an update on the status of any delays or closures.  Dermatology Medication Tips: Please keep the boxes that topical medications come in in order to help keep track of the instructions about where and how to use these. Pharmacies typically print the medication instructions only on the boxes and not directly on the medication tubes.   If your medication is too expensive, please contact our office at [phone] option 4 or send us  a message through MyChart.   We are unable to tell what your co-pay for medications will be in advance as this is different depending on your insurance coverage. However, we may be able to find a substitute medication at lower cost or fill out paperwork to get insurance to cover a needed medication.   If a prior authorization is required to get your medication covered by your insurance company, please allow us  1-2 business days to complete this process.  Drug prices often vary depending on where the prescription is filled and some pharmacies may offer cheaper prices.  The website www.goodrx.com contains coupons for medications through different pharmacies. The prices here do not account for what the cost may be with help from insurance (it may be cheaper with your insurance), but the website can give you the price if you did not use any insurance.  - You can print the associated coupon and take it with your prescription to the pharmacy.  - You may also stop by our office during regular business hours and pick up a GoodRx coupon card.  - If you need your prescription sent electronically to a different pharmacy, notify our office  through [facility] or by phone at [phone] option 4.     Si Usted Necesita Algo Despus de Su Visita  Tambin puede enviarnos un mensaje a travs de Clinical Cytogeneticist. Por lo general respondemos a los mensajes de MyChart en el transcurso de 1 a 2 das hbiles.  Para renovar recetas, por favor pida a su farmacia que se ponga en contacto con nuestra oficina. Randi lakes de fax es [place] [phone].  Si tiene un asunto urgente cuando la clnica est cerrada y que no puede esperar hasta el siguiente da hbil, puede llamar/localizar a su doctor(a) al nmero que aparece a continuacin.   Por favor, tenga en cuenta que aunque hacemos todo lo posible para estar disponibles para asuntos urgentes fuera del horario de [place], no estamos disponibles las 24 horas del da, los 7 [address]  [address] de la [place].   Si tiene un problema urgente y no puede comunicarse con nosotros, puede optar por buscar atencin mdica  en el consultorio de su doctor(a), en ignacia  clnica privada, en un centro de atencin urgente o en una sala de emergencias.  Si tiene engineer, drilling, por favor llame inmediatamente al 911 o vaya a la sala de emergencias.  Nmeros de bper  - Dr. Hester: [phone]  - Dra. Jackquline: [phone]  - Dr. Claudene: [phone]  - Dra. Kitts: [phone]  En caso de inclemencias del [place], por favor llame a nuestra lnea principal al [phone] para una actualizacin sobre el estado de cualquier retraso o cierre.  Consejos para la medicacin en dermatologa: Por favor, guarde las cajas en las que vienen los medicamentos de uso tpico para ayudarle a seguir las instrucciones sobre dnde y cmo usarlos. Las farmacias generalmente imprimen las instrucciones del medicamento slo en las cajas y no directamente en los tubos del [place].   Si su medicamento es muy caro, por favor, pngase en contacto con landry rieger llamando al [phone] y presione la opcin 4 o envenos un mensaje  a travs de Clinical Cytogeneticist.   No podemos decirle cul ser su copago por los medicamentos por adelantado ya que esto es diferente dependiendo de la cobertura de su seguro. Sin embargo, es posible que podamos encontrar un medicamento sustituto a audiological scientist un formulario para que el seguro cubra el medicamento que se considera necesario.   Si se requiere una autorizacin previa para que su compaa de seguros cubra su medicamento, por favor permtanos de 1 a 2 das hbiles para completar este proceso.  Los precios de los medicamentos varan con frecuencia dependiendo del environmental consultant de dnde se surte la receta y alguna farmacias pueden ofrecer precios ms baratos.  El sitio web www.goodrx.com tiene cupones para medicamentos de health and safety inspector. Los precios aqu no tienen en cuenta lo que podra costar con la ayuda del seguro (puede ser ms barato con su seguro), pero el sitio web puede darle el precio si no utiliz tourist information centre manager.  - Puede imprimir el cupn correspondiente y llevarlo con su receta a la farmacia.  - Tambin puede pasar por nuestra oficina durante el horario de atencin regular y education officer, museum una tarjeta de cupones de GoodRx.  - Si necesita que su receta se enve electrnicamente a una farmacia diferente, informe a nuestra oficina a travs de MyChart de [place] o por telfono llamando al [phone] y presione la opcin 4.

## 2024-07-26 NOTE — Progress Notes (Signed)
 "  Follow-Up Visit   Subjective  Ralph Brown is a 84 y.o. male who presents for the following: Skin Cancer Screening and Upper Body Skin Exam  report a mole at right chest that was bleeding for 3 weeks reports may have bumped it on something. He says looks resolved today but would still like checked. Also has some spots on scalp he would like checked    The patient presents for Upper Body Skin Exam (UBSE) for skin cancer screening and mole check. The patient has spots, moles and lesions to be evaluated, some may be new or changing and the patient may have concern these could be cancer.   The following portions of the chart were reviewed this encounter and updated as appropriate: medications, allergies, medical history  Review of Systems:  No other skin or systemic complaints except as noted in HPI or Assessment and Plan.  Objective  Well appearing patient in no apparent distress; mood and affect are within normal limits.  All skin waist up examined. Relevant physical exam findings are noted in the Assessment and Plan. scalp x 6 (6) Erythematous thin papules/macules with gritty scale.  left chest x 1, back x 17 (18) Erythematous stuck-on, waxy papule or plaque  Assessment & Plan   ACTINIC KERATOSIS (6) scalp x 6 (6) Actinic keratoses are precancerous spots that appear secondary to cumulative UV radiation exposure/sun exposure over time. They are chronic with expected duration over 1 year. A portion of actinic keratoses will progress to squamous cell carcinoma of the skin. It is not possible to reliably predict which spots will progress to skin cancer and so treatment is recommended to prevent development of skin cancer.  Recommend daily broad spectrum sunscreen SPF 30+ to sun-exposed areas, reapply every 2 hours as needed.  Recommend staying in the shade or wearing long sleeves, sun glasses (UVA+UVB protection) and wide brim hats (4-inch brim around the entire circumference of the  hat). Call for new or changing lesions. - Destruction of lesion - scalp x 6 (6) Complexity: simple   Destruction method: cryotherapy   Informed consent: discussed and consent obtained   Timeout:  patient name, date of birth, surgical site, and procedure verified Lesion destroyed using liquid nitrogen: Yes   Region frozen until ice ball extended beyond lesion: Yes   Outcome: patient tolerated procedure well with no complications   Post-procedure details: wound care instructions given    INFLAMED SEBORRHEIC KERATOSIS (18) left chest x 1, back x 17 (18) Symptomatic, irritating, patient would like treated. - Destruction of lesion - left chest x 1, back x 17 (18) Complexity: simple   Destruction method: cryotherapy   Informed consent: discussed and consent obtained   Timeout:  patient name, date of birth, surgical site, and procedure verified Lesion destroyed using liquid nitrogen: Yes   Region frozen until ice ball extended beyond lesion: Yes   Outcome: patient tolerated procedure well with no complications   Post-procedure details: wound care instructions given      Skin cancer screening performed today.  Actinic Damage - Chronic condition, secondary to cumulative UV/sun exposure - diffuse scaly erythematous macules with underlying dyspigmentation - Recommend daily broad spectrum sunscreen SPF 30+ to sun-exposed areas, reapply every 2 hours as needed.  - Staying in the shade or wearing long sleeves, sun glasses (UVA+UVB protection) and wide brim hats (4-inch brim around the entire circumference of the hat) are also recommended for sun protection.  - Call for new or changing lesions.  Lentigines, Seborrheic Keratoses, Hemangiomas - Benign normal skin lesions - Benign-appearing - Call for any changes - hemangiomas - back, arms, chest,   Melanocytic Nevi - Tan-brown and/or pink-flesh-colored symmetric macules and papules - Benign appearing on exam today - Observation - Call  clinic for new or changing moles - Recommend daily use of broad spectrum spf 30+ sunscreen to sun-exposed areas.   Resolving injured hemangioma at right chest Hx of trauma  Exam: healing  Treatment Plan: Benign. Observe Resolving and healed no recommended treatment    Return for keep followup as scheduled in July 2026.  IEleanor Blush, CMA, am acting as scribe for Alm Rhyme, MD.   Documentation: I have reviewed the above documentation for accuracy and completeness, and I agree with the above.  Alm Rhyme, MD       "

## 2025-02-01 ENCOUNTER — Ambulatory Visit: Admitting: Dermatology
